# Patient Record
Sex: Male | Born: 1980
Health system: Southern US, Community
[De-identification: ages and names within clinical notes are randomized; demographics above are authoritative.]

## PROBLEM LIST (undated history)

## (undated) DIAGNOSIS — E538 Deficiency of other specified B group vitamins: Secondary | ICD-10-CM

## (undated) DIAGNOSIS — M069 Rheumatoid arthritis, unspecified: Secondary | ICD-10-CM

## (undated) DIAGNOSIS — I10 Essential (primary) hypertension: Secondary | ICD-10-CM

## (undated) DIAGNOSIS — E119 Type 2 diabetes mellitus without complications: Secondary | ICD-10-CM

## (undated) DIAGNOSIS — E785 Hyperlipidemia, unspecified: Secondary | ICD-10-CM

## (undated) HISTORY — DX: Essential (primary) hypertension: I10

## (undated) HISTORY — DX: Deficiency of other specified B group vitamins: E53.8

## (undated) HISTORY — PX: NO PAST SURGERIES: SHX2092

## (undated) HISTORY — DX: Hyperlipidemia, unspecified: E78.5

## (undated) HISTORY — DX: Type 2 diabetes mellitus without complications: E11.9

---

## 2010-09-19 ENCOUNTER — Ambulatory Visit: Payer: Self-pay | Admitting: Internal Medicine

## 2011-10-30 ENCOUNTER — Emergency Department: Payer: Self-pay | Admitting: Emergency Medicine

## 2011-10-30 LAB — BASIC METABOLIC PANEL
BUN: 13 mg/dL (ref 7–18)
Calcium, Total: 8.4 mg/dL — ABNORMAL LOW (ref 8.5–10.1)
Chloride: 107 mmol/L (ref 98–107)
Creatinine: 0.72 mg/dL (ref 0.60–1.30)
EGFR (African American): >60
Glucose: 114 mg/dL — ABNORMAL HIGH (ref 65–99)
Osmolality: 282 (ref 275–301)
Potassium: 4.1 mmol/L (ref 3.5–5.1)
Sodium: 141 mmol/L (ref 136–145)

## 2011-10-30 LAB — CBC
HGB: 15.2 g/dL (ref 13.0–18.0)
MCH: 29.5 pg (ref 26.0–34.0)
MCHC: 33.7 g/dL (ref 32.0–36.0)
MCV: 87 fL (ref 80–100)
Platelet: 213 10*3/uL (ref 150–440)
RBC: 5.17 10*6/uL (ref 4.40–5.90)

## 2014-02-16 DIAGNOSIS — E538 Deficiency of other specified B group vitamins: Secondary | ICD-10-CM | POA: Insufficient documentation

## 2014-11-30 ENCOUNTER — Encounter: Payer: Self-pay | Admitting: Cardiovascular Disease

## 2014-11-30 ENCOUNTER — Ambulatory Visit (INDEPENDENT_AMBULATORY_CARE_PROVIDER_SITE_OTHER): Payer: 59 | Admitting: Cardiovascular Disease

## 2014-11-30 ENCOUNTER — Encounter (INDEPENDENT_AMBULATORY_CARE_PROVIDER_SITE_OTHER): Payer: Self-pay

## 2014-11-30 DIAGNOSIS — I10 Essential (primary) hypertension: Secondary | ICD-10-CM | POA: Insufficient documentation

## 2014-11-30 DIAGNOSIS — E669 Obesity, unspecified: Secondary | ICD-10-CM | POA: Diagnosis not present

## 2014-11-30 DIAGNOSIS — E785 Hyperlipidemia, unspecified: Secondary | ICD-10-CM | POA: Diagnosis not present

## 2014-11-30 DIAGNOSIS — I159 Secondary hypertension, unspecified: Secondary | ICD-10-CM

## 2014-11-30 DIAGNOSIS — E119 Type 2 diabetes mellitus without complications: Secondary | ICD-10-CM

## 2014-11-30 NOTE — Patient Instructions (Signed)
You are doing well. No medication changes were made.  Blood pressure goal <135 on the top, <90 on the bottom  Please monitor your blood pressure at home Call the office with some numbers  Please call us if you have new issues that need to be addressed before your next appt.  Your physician wants you to follow-up in: 12 months.  You will receive a reminder letter in the mail two months in advance. If you don't receive a letter, please call our office to schedule the follow-up appointment.

## 2014-11-30 NOTE — Assessment & Plan Note (Signed)
Recommended he closely monitor his blood pressure at home. He does own a blood pressure cuff. Diastolic pressures running high, will monitor closely at home If he needs additional blood pressure medications, could potentially add clonidine, Cardura, bystolic

## 2014-11-30 NOTE — Progress Notes (Signed)
Patient ID: Casey Townsend, male    DOB: 1981/02/11, 34 y.o.   MRN: [id number]  HPI Comments: Casey Townsend is a pleasant 34 year old gentleman, patient of Dr. Renae Fickle, with history of type 2 diabetes diagnosed March 2014 with most recent hemoglobin A1c 5.6, hyperlipidemia with LDL 94, obesity, hypertension who presents to establish care in the [place] office.  He reports that in general he is feeling well with no complaints. He does have a blood pressure cuff at home but has not been monitoring his blood pressure. Blood pressure elevated today. Was also elevated when last seen by Dr. Renae Fickle. Reports his diabetes symptoms presented with vision issues, now resolved. He is trying to watch his weight, watch his diet.Marland Kitchen He works every night in a mill. No regular aerobic exercise. Continues to struggle with his weight which has been relatively stable.  Weight was 206 pounds 2 years ago, now 223 with Dr. Renae Fickle, 234 in our office  EKG on today's visit shows normal sinus rhythm with rate 86 bpm, no significant ST or T-wave changes    Allergies  Allergen Reactions  . Lisinopril Anaphylaxis    No current outpatient prescriptions on file prior to visit.   No current facility-administered medications on file prior to visit.    Past Medical History  Diagnosis Date  . Hypertension   . Diabetes mellitus without complication   . Vitamin B 12 deficiency   . Hyperlipidemia     History reviewed. No pertinent past surgical history.  Social History  reports that he has never smoked. He does not have any smokeless tobacco history on file. He reports that he drinks about 1.2 oz of alcohol per week. He reports that he does not use illicit drugs.  Family History family history includes Hyperlipidemia in his mother; Hypertension in his mother.      Review of Systems  Constitutional: Negative.   HENT: Negative.   Eyes: Negative.   Respiratory: Negative.   Cardiovascular: Negative.    Gastrointestinal: Negative.   Endocrine: Negative.   Musculoskeletal: Negative.   Skin: Negative.   Allergic/Immunologic: Negative.   Neurological: Negative.   Hematological: Negative.   Psychiatric/Behavioral: Negative.   All other systems reviewed and are negative.   BP 140/96 mmHg  Pulse 86  Ht 5\' 1"  (1.549 m)  Wt 234 lb 12 oz (106.482 kg)  BMI 44.38 kg/m2   Physical Exam  Constitutional: He is oriented to person, place, and time. He appears well-developed and well-nourished.  Obese  HENT:  Head: Normocephalic.  Nose: Nose normal.  Mouth/Throat: Oropharynx is clear and moist.  Eyes: Conjunctivae are normal. Pupils are equal, round, and reactive to light.  Neck: Normal range of motion. Neck supple. No JVD present.  Cardiovascular: Normal rate, regular rhythm, normal heart sounds and intact distal pulses.  Exam reveals no gallop and no friction rub.   No murmur heard. Pulmonary/Chest: Effort normal and breath sounds normal. No respiratory distress. He has no wheezes. He has no rales. He exhibits no tenderness.  Abdominal: Soft. Bowel sounds are normal. He exhibits no distension. There is no tenderness.  Musculoskeletal: Normal range of motion. He exhibits no edema or tenderness.  Lymphadenopathy:    He has no cervical adenopathy.  Neurological: He is alert and oriented to person, place, and time. Coordination normal.  Skin: Skin is warm and dry. No rash noted. No erythema.  Psychiatric: He has a normal mood and affect. His behavior is normal. Judgment and thought content normal.

## 2014-11-30 NOTE — Assessment & Plan Note (Signed)
Dietary guide given to him today. Recommended a low carbohydrate diet

## 2014-11-30 NOTE — Assessment & Plan Note (Signed)
We have encouraged continued exercise, careful diet management in an effort to lose weight. Followed by Dr. Renae Fickle

## 2014-11-30 NOTE — Assessment & Plan Note (Signed)
Cholesterol is at goal on the current lipid regimen. No changes to the medications were made.  

## 2015-03-11 ENCOUNTER — Other Ambulatory Visit: Payer: Self-pay | Admitting: Orthopedic Surgery

## 2015-03-11 DIAGNOSIS — M5416 Radiculopathy, lumbar region: Secondary | ICD-10-CM

## 2015-03-31 ENCOUNTER — Ambulatory Visit
Admission: RE | Admit: 2015-03-31 | Discharge: 2015-03-31 | Disposition: A | Discharge status: Home / Self Care | Payer: 59 | Source: Ambulatory Visit | Attending: Orthopedic Surgery | Admitting: Orthopedic Surgery

## 2015-03-31 DIAGNOSIS — M5416 Radiculopathy, lumbar region: Secondary | ICD-10-CM | POA: Insufficient documentation

## 2016-02-11 ENCOUNTER — Telehealth: Payer: Self-pay | Admitting: Cardiovascular Disease

## 2016-02-11 NOTE — Telephone Encounter (Signed)
Attempted to schedule fu from recall.  Patient changed providers   Deleting recall.

## 2016-03-06 ENCOUNTER — Ambulatory Visit: Payer: 59 | Admitting: Cardiovascular Disease

## 2016-05-01 DIAGNOSIS — I10 Essential (primary) hypertension: Secondary | ICD-10-CM | POA: Diagnosis not present

## 2016-05-01 DIAGNOSIS — E119 Type 2 diabetes mellitus without complications: Secondary | ICD-10-CM | POA: Diagnosis not present

## 2016-05-31 DIAGNOSIS — R509 Fever, unspecified: Secondary | ICD-10-CM | POA: Diagnosis not present

## 2016-05-31 DIAGNOSIS — I10 Essential (primary) hypertension: Secondary | ICD-10-CM | POA: Diagnosis not present

## 2016-07-24 DIAGNOSIS — E538 Deficiency of other specified B group vitamins: Secondary | ICD-10-CM | POA: Diagnosis not present

## 2016-07-24 DIAGNOSIS — E119 Type 2 diabetes mellitus without complications: Secondary | ICD-10-CM | POA: Diagnosis not present

## 2016-07-24 DIAGNOSIS — E785 Hyperlipidemia, unspecified: Secondary | ICD-10-CM | POA: Diagnosis not present

## 2016-08-03 DIAGNOSIS — E785 Hyperlipidemia, unspecified: Secondary | ICD-10-CM | POA: Diagnosis not present

## 2016-08-03 DIAGNOSIS — E119 Type 2 diabetes mellitus without complications: Secondary | ICD-10-CM | POA: Diagnosis not present

## 2016-08-03 DIAGNOSIS — E538 Deficiency of other specified B group vitamins: Secondary | ICD-10-CM | POA: Diagnosis not present

## 2016-11-21 DIAGNOSIS — E785 Hyperlipidemia, unspecified: Secondary | ICD-10-CM | POA: Diagnosis not present

## 2016-11-21 DIAGNOSIS — E538 Deficiency of other specified B group vitamins: Secondary | ICD-10-CM | POA: Diagnosis not present

## 2016-11-21 DIAGNOSIS — E119 Type 2 diabetes mellitus without complications: Secondary | ICD-10-CM | POA: Diagnosis not present

## 2017-02-01 DIAGNOSIS — E119 Type 2 diabetes mellitus without complications: Secondary | ICD-10-CM | POA: Diagnosis not present

## 2017-02-22 DIAGNOSIS — E538 Deficiency of other specified B group vitamins: Secondary | ICD-10-CM | POA: Diagnosis not present

## 2017-02-22 DIAGNOSIS — E785 Hyperlipidemia, unspecified: Secondary | ICD-10-CM | POA: Diagnosis not present

## 2017-02-22 DIAGNOSIS — E119 Type 2 diabetes mellitus without complications: Secondary | ICD-10-CM | POA: Diagnosis not present

## 2017-05-18 DIAGNOSIS — E785 Hyperlipidemia, unspecified: Secondary | ICD-10-CM | POA: Diagnosis not present

## 2017-05-18 DIAGNOSIS — E119 Type 2 diabetes mellitus without complications: Secondary | ICD-10-CM | POA: Diagnosis not present

## 2017-05-22 DIAGNOSIS — E538 Deficiency of other specified B group vitamins: Secondary | ICD-10-CM | POA: Diagnosis not present

## 2017-05-22 DIAGNOSIS — E785 Hyperlipidemia, unspecified: Secondary | ICD-10-CM | POA: Diagnosis not present

## 2017-05-22 DIAGNOSIS — E119 Type 2 diabetes mellitus without complications: Secondary | ICD-10-CM | POA: Diagnosis not present

## 2018-08-19 ENCOUNTER — Encounter: Payer: Self-pay | Admitting: Emergency Medicine

## 2018-08-19 ENCOUNTER — Other Ambulatory Visit: Payer: Self-pay

## 2018-08-19 ENCOUNTER — Ambulatory Visit (INDEPENDENT_AMBULATORY_CARE_PROVIDER_SITE_OTHER): Payer: 59

## 2018-08-19 ENCOUNTER — Ambulatory Visit
Admission: EM | Admit: 2018-08-19 | Discharge: 2018-08-19 | Disposition: A | Discharge status: Home / Self Care | Payer: 59 | Attending: Family Medicine | Admitting: Family Medicine

## 2018-08-19 DIAGNOSIS — M79644 Pain in right finger(s): Secondary | ICD-10-CM | POA: Diagnosis not present

## 2018-08-19 MED ORDER — MELOXICAM 15 MG PO TABS: 15.0000 mg | ORAL_TABLET | Freq: Every day | ORAL | 0 refills | Status: DC | PRN

## 2018-08-19 NOTE — ED Triage Notes (Signed)
Pt c/o right middle finger pain and swelling. Started a couple of weeks ago. No known injury.

## 2018-08-19 NOTE — ED Provider Notes (Addendum)
[facility] ____________________________________________  Time seen: Approximately 8:49 AM  I have reviewed the triage vital signs and the nursing notes.   HISTORY  Chief Complaint Hand Pain (right middle finger)   HPI Casey Townsend is a 38 y.o. male presenting for evaluation of right middle finger swelling and pain present for approximately 1 week.  Denies any known direct injury or trauma.  Reports he is right-handed and does a lot of repetitive movements with his hands, particularly at work.  Does frequently hold a knife in his right hand at work.  States pain to finger is only with direct palpation to swelling area.  Denies paresthesias, decreased range of motion, redness or skin changes.  Right hand otherwise feels normal.  Denies history of same.  Pain currently mild.  Reports otherwise feeling well.  Denies cough, congestion, fevers, chest pain or shortness of breath. Denies renal insufficiency.    Past Medical History:  Diagnosis Date  . Diabetes mellitus without complication (HCC)   . Hyperlipidemia   . Hypertension   . Vitamin B 12 deficiency     Patient Active Problem List   Diagnosis Date Noted  . Essential hypertension 11/30/2014  . Hyperlipidemia 11/30/2014  . Diabetes mellitus type 2, controlled (HCC) 11/30/2014  . Obesity 11/30/2014    Past Surgical History:  Procedure Laterality Date  . NO PAST SURGERIES       No current facility-administered medications for this encounter.   Current Outpatient Medications:  .  amLODipine (NORVASC) 10 MG tablet, Take 10 mg by mouth daily. , Disp: , Rfl:  .  losartan-hydrochlorothiazide (HYZAAR) 100-25 MG per tablet, Take 1 tablet by mouth daily. , Disp: , Rfl:  .  metFORMIN (GLUCOPHAGE) 500 MG tablet, Take 500 mg by mouth 2 (two) times daily with a meal. , Disp: , Rfl:  .  pravastatin (PRAVACHOL) 10 MG tablet, Take 10 mg by mouth every other day. , Disp: , Rfl:  .  meloxicam (MOBIC) 15 MG tablet, Take 1  tablet (15 mg total) by mouth daily as needed., Disp: 10 tablet, Rfl: 0 .  ONETOUCH VERIO test strip, , Disp: , Rfl:   Allergies Lisinopril  Family History  Problem Relation Age of Onset  . Hypertension Mother   . Hyperlipidemia Mother     Social History Social History   Tobacco Use  . Smoking status: Never Smoker  . Smokeless tobacco: Never Used  Substance Use Topics  . Alcohol use: Yes    Alcohol/week: 2.0 standard drinks    Types: 2 Cans of beer per week  . Drug use: No    Review of Systems Constitutional: No fever Cardiovascular: Denies chest pain. Respiratory: Denies shortness of breath. Musculoskeletal: Positive right hand pain. Skin: Negative for rash.   ____________________________________________   PHYSICAL EXAM:  VITAL SIGNS: ED Triage Vitals  Enc Vitals Group     BP 08/19/18 0841 (!) 140/99     Pulse Rate 08/19/18 0841 88     Resp 08/19/18 0841 18     Temp 08/19/18 0841 98.1 F (36.7 C)     Temp Source 08/19/18 0841 Oral     SpO2 08/19/18 0841 97 %     Weight 08/19/18 0839 234 lb (106.1 kg)     Height 08/19/18 0839 5' (1.524 m)     Head Circumference --      Peak Flow --      Pain Score 08/19/18 0839 5     Pain Loc --  Pain Edu? --      Excl. in GC? --     Constitutional: Alert and oriented. Well appearing and in no acute distress. ENT      Head: Normocephalic and atraumatic. Cardiovascular: Normal rate, regular rhythm. Grossly normal heart sounds.  Good peripheral circulation. Respiratory: Normal respiratory effort without tachypnea nor retractions. Breath sounds are clear and equal bilaterally. No wheezes, rales, rhonchi. Musculoskeletal: Steady gait Except: Right third digit medial aspect of PIP joint mild localized firm swelling with associated tenderness, no erythema, no fluctuance, full range of motion present, no motor or tendon deficit noted to right third digit, normal distal sensation and capillary refill, right hand otherwise  nontender. Neurologic:  Normal speech and language.  Skin:  Skin is warm, dry and intact. No rash noted. Psychiatric: Mood and affect are normal. Speech and behavior are normal. Patient exhibits appropriate insight and judgment   ___________________________________________   LABS (all labs ordered are listed, but only abnormal results are displayed)  Labs Reviewed - No data to display ____________________________________________  RADIOLOGY  Dg Finger Middle Right  Result Date: 08/19/2018 CLINICAL DATA:  38 year old male with joint pain EXAM: RIGHT MIDDLE FINGER 2+V COMPARISON:  None. FINDINGS: Contour asymmetry on the radial aspect of the PIP joint of the third digit, right hand. No acute fracture. No subluxation/dislocation. No erosive changes or significant degenerative changes. No osteopenia. No radiopaque foreign body. IMPRESSION: Negative for acute bony abnormality. There is nonspecific soft tissue asymmetry along the radial aspect of the PIP joint of third digit right hand. Electronically Signed   By: Gilmer Mor D.O.   On: 08/19/2018 09:10   ____________________________________________   PROCEDURES Procedures     INITIAL IMPRESSION / ASSESSMENT AND PLAN / ED COURSE  Pertinent labs & imaging results that were available during my care of the patient were reviewed by me and considered in my medical decision making (see chart for details).  Well-appearing patient.  No acute distress.  Right third digit pain.  Frequent repetitive movements.  No erythema, no skin changes or break in skin, doubt infection.  X-ray as above per radiologist, negative for acute bony abnormality, asymmetry as noted.  Suspect overuse calcific area.  Will treat with oral Mobic, discussed monitoring and trying to alter repetitive movements to area.  Follow-up with orthopedic for continued complaints.  Supportive care.Discussed indication, risks and benefits of medications with patient.  Discussed follow  up with Primary care physician this week as needed. Discussed follow up and return parameters including no resolution or any worsening concerns. Patient verbalized understanding and agreed to plan.   ____________________________________________   FINAL CLINICAL IMPRESSION(S) / ED DIAGNOSES  Final diagnoses:  Pain of right middle finger     ED Discharge Orders         Ordered    meloxicam (MOBIC) 15 MG tablet  Daily PRN     08/19/18 0920           Note: This dictation was prepared with Dragon dictation along with smaller phrase technology. Any transcriptional errors that result from this process are unintentional.         Renford Dills, NP 08/19/18 0950    Renford Dills, NP 08/19/18 [phone]

## 2018-08-19 NOTE — Discharge Instructions (Signed)
Take medication as prescribed. Rest. Monitor how you're holding items at work.   Follow up with orthopedic as needed for continued discomfort.  Follow up with your primary care physician this week as needed. Return to Urgent care for new or worsening concerns.

## 2018-12-17 ENCOUNTER — Ambulatory Visit
Admission: EM | Admit: 2018-12-17 | Discharge: 2018-12-17 | Disposition: A | Discharge status: Home / Self Care | Payer: 59 | Attending: Emergency Medicine | Admitting: Emergency Medicine

## 2018-12-17 ENCOUNTER — Other Ambulatory Visit: Payer: Self-pay

## 2018-12-17 DIAGNOSIS — M722 Plantar fascial fibromatosis: Secondary | ICD-10-CM | POA: Diagnosis not present

## 2018-12-17 MED ORDER — MELOXICAM 15 MG PO TABS: 15.0000 mg | ORAL_TABLET | Freq: Every day | ORAL | 0 refills | Status: DC | PRN

## 2018-12-17 NOTE — ED Triage Notes (Signed)
Patient complains of bilateral foot pain from heel and runs down the side of his foot.

## 2018-12-17 NOTE — Discharge Instructions (Addendum)
Take medication as prescribed. Ice. Stretch. Good supportive shoes.   Follow up with podiatry in 1-2 weeks as needed for continued pain.   Follow up with your primary care physician this week as needed. Return to Urgent care for new or worsening concerns.

## 2018-12-17 NOTE — ED Provider Notes (Signed)
[facility] ____________________________________________  Time seen: Approximately 12:43 PM  I have reviewed the triage vital signs and the nursing notes.   HISTORY  Chief Complaint Foot Pain (bilateral)   HPI Casey Townsend is a 38 y.o. male presenting for evaluation of bilateral heel pain present for the last 2 to 3 weeks.  Reports this is been gradual in onset.  Denies abrupt onset, fall, injury or direct trauma.  Does report he has to wear steel toed boots and stands on a hard concrete surface all day, up to 12 hours per shift.  Does have a pad that he can stand on.  Also recently 2 weeks ago purchased insert soles to his shoes to help, which did help some.  States he does have some pain per se in the morning, but pain is worse after a full day of work.  States has occasionally taken ibuprofen which takes away the pain.  Denies pain radiation, paresthesias, skin changes or injury.  Reports otherwise doing well.  No recent chest pain, shortness of breath or fevers.  States he only got up just a little while ago and has not yet taken his blood pressure medicine or eaten.  States he will take it as soon as he leaves with food.  Reports his blood pressure has been well managed recently.  Reports otherwise doing well.   Past Medical History:  Diagnosis Date  . Diabetes mellitus without complication ([place])   . Hyperlipidemia   . Hypertension   . Vitamin B 12 deficiency     Patient Active Problem List   Diagnosis Date Noted  . Essential hypertension 11/30/2014  . Hyperlipidemia 11/30/2014  . Diabetes mellitus type 2, controlled ([place]) 11/30/2014  . Obesity 11/30/2014    Past Surgical History:  Procedure Laterality Date  . NO PAST SURGERIES       No current facility-administered medications for this encounter.   Current Outpatient Medications:  .  amLODipine (NORVASC) 10 MG tablet, Take 10 mg by mouth daily. , Disp: , Rfl:  .  losartan-hydrochlorothiazide (HYZAAR)  100-25 MG per tablet, Take 1 tablet by mouth daily. , Disp: , Rfl:  .  metFORMIN (GLUCOPHAGE) 500 MG tablet, Take 500 mg by mouth 2 (two) times daily with a meal. , Disp: , Rfl:  .  ONETOUCH VERIO test strip, , Disp: , Rfl:  .  pravastatin (PRAVACHOL) 10 MG tablet, Take 10 mg by mouth every other day. , Disp: , Rfl:  .  meloxicam (MOBIC) 15 MG tablet, Take 1 tablet (15 mg total) by mouth daily as needed., Disp: 10 tablet, Rfl: 0  Allergies Lisinopril  Family History  Problem Relation Age of Onset  . Hypertension Mother   . Hyperlipidemia Mother     Social History Social History   Tobacco Use  . Smoking status: Never Smoker  . Smokeless tobacco: Never Used  Substance Use Topics  . Alcohol use: Yes    Alcohol/week: 2.0 standard drinks    Types: 2 Cans of beer per week  . Drug use: No    Review of Systems Constitutional: No fever ENT: No sore throat. Cardiovascular: Denies chest pain. Respiratory: Denies shortness of breath. Gastrointestinal: No abdominal pain.   Musculoskeletal: Positive bilateral heel pain. Skin: Negative for rash.   ____________________________________________   PHYSICAL EXAM:  VITAL SIGNS: ED Triage Vitals [12/17/18 1219]  Enc Vitals Group     BP (!) 149/111     Pulse Rate 86     Resp 18  Temp 98.1 F (36.7 C)     Temp Source Oral     SpO2 97 %     Weight 234 lb (106.1 kg)     Height 5' (1.524 m)     Head Circumference      Peak Flow      Pain Score 5     Pain Loc      Pain Edu?      Excl. in GC?     Constitutional: Alert and oriented. Well appearing and in no acute distress. Eyes: Conjunctivae are normal.  ENT      Head: Normocephalic and atraumatic. Cardiovascular: Normal rate, regular rhythm. Grossly normal heart sounds.  Good peripheral circulation. Respiratory: Normal respiratory effort without tachypnea nor retractions. Breath sounds are clear and equal bilaterally. No wheezes, rales, rhonchi. Musculoskeletal:  Bilateral  pedal pulses equal and easily palpated. Except: Left plantar heel minimal tenderness to direct palpation at proximal arch, foot otherwise nontender, no bony tenderness, full range of motion present. Except: Right plantar hill at proximal arch and mid to laterally tenderness to direct palpation, minimal tenderness along arch, no bony tenderness, full range of motion present, mild tightness with full dorsiflexion, no pain with plantarflexion, normal distal sensation and capillary refill, no bony tenderness, right leg otherwise nontender.  No edema. Neurologic:  Normal speech and language. No gait instability.  Skin:  Skin is warm, dry and intact. No rash noted. Psychiatric: Mood and affect are normal. Speech and behavior are normal. Patient exhibits appropriate insight and judgment   ___________________________________________   LABS (all labs ordered are listed, but only abnormal results are displayed)  Labs Reviewed - No data to display   INITIAL IMPRESSION / ASSESSMENT AND PLAN / ED COURSE  Pertinent labs & imaging results that were available during my care of the patient were reviewed by me and considered in my medical decision making (see chart for details).  Well-appearing patient.  No acute distress.  Bilateral heel pain.  Works on hard surface all day and wear steel toed boots.  Subjective report and clinical exam consistent with plantar fasciitis.  Patient denies direct injury or trauma, will defer x-ray at this time, patient agrees.  Will treat with Mobic.  Discussed ice, stretches, supportive care, good inserts and supportive shoes.  Follow-up with podiatry for continued pain.Discussed indication, risks and benefits of medications with patient.  Discussed follow up and return parameters including no resolution or any worsening concerns. Patient verbalized understanding and agreed to plan.   ____________________________________________   FINAL CLINICAL IMPRESSION(S) / ED DIAGNOSES   Final diagnoses:  Plantar fasciitis     ED Discharge Orders         Ordered    meloxicam (MOBIC) 15 MG tablet  Daily PRN     12/17/18 1241           Note: This dictation was prepared with Dragon dictation along with smaller phrase technology. Any transcriptional errors that result from this process are unintentional.         Renford Dills, NP 12/17/18 1250

## 2019-01-20 ENCOUNTER — Ambulatory Visit (INDEPENDENT_AMBULATORY_CARE_PROVIDER_SITE_OTHER): Payer: 59

## 2019-01-20 ENCOUNTER — Ambulatory Visit: Payer: 59 | Admitting: Podiatry

## 2019-01-20 ENCOUNTER — Encounter: Payer: Self-pay | Admitting: Podiatry

## 2019-01-20 ENCOUNTER — Other Ambulatory Visit: Payer: Self-pay

## 2019-01-20 DIAGNOSIS — M722 Plantar fascial fibromatosis: Secondary | ICD-10-CM

## 2019-01-20 MED ORDER — MELOXICAM 15 MG PO TABS: 15.0000 mg | ORAL_TABLET | Freq: Every day | ORAL | 3 refills | Status: DC

## 2019-01-20 MED ORDER — METHYLPREDNISOLONE 4 MG PO TBPK: ORAL_TABLET | ORAL | 0 refills | Status: DC

## 2019-01-20 NOTE — Progress Notes (Signed)
Subjective:  Patient ID: Casey Townsend, male    DOB: [birth year],  MRN: [id number] HPI Chief Complaint  Patient presents with  . Foot Pain    Patient presents today for bilat heel/lateral side foot pain x 2-3 weeks.  He reports right is worse than left and feels like he is walking on a bruise.  Mornings are very painful and when standing from sitting.  He has taken Advil, Meloxicam, used ice and inserts with some reilef    38 y.o. male presents with the above complaint.   ROS: He denies fever chills nausea vomiting muscle aches pains calf pain back pain chest pain shortness of breath.  Past Medical History:  Diagnosis Date  . Diabetes mellitus without complication (HCC)   . Hyperlipidemia   . Hypertension   . Vitamin B 12 deficiency    Past Surgical History:  Procedure Laterality Date  . NO PAST SURGERIES      Current Outpatient Medications:  .  empagliflozin (JARDIANCE) 10 MG TABS tablet, Take by mouth., Disp: , Rfl:  .  amLODipine (NORVASC) 10 MG tablet, Take 10 mg by mouth daily. , Disp: , Rfl:  .  hydrochlorothiazide (HYDRODIURIL) 25 MG tablet, , Disp: , Rfl:  .  losartan (COZAAR) 100 MG tablet, , Disp: , Rfl:  .  meloxicam (MOBIC) 15 MG tablet, Take 1 tablet (15 mg total) by mouth daily., Disp: 30 tablet, Rfl: 3 .  metFORMIN (GLUCOPHAGE) 500 MG tablet, Take 500 mg by mouth 2 (two) times daily with a meal. , Disp: , Rfl:  .  Multiple Vitamin (MULTI-VITAMIN) tablet, Take by mouth., Disp: , Rfl:  .  ONETOUCH VERIO test strip, , Disp: , Rfl:  .  pravastatin (PRAVACHOL) 10 MG tablet, Take 10 mg by mouth every other day. , Disp: , Rfl:   Allergies  Allergen Reactions  . Lisinopril Anaphylaxis   Review of Systems Objective:  There were no vitals filed for this visit.  General: Well developed, nourished, in no acute distress, alert and oriented x3   Dermatological: Skin is warm, dry and supple bilateral. Nails x 10 are well maintained; remaining integument appears  unremarkable at this time. There are no open sores, no preulcerative lesions, no rash or signs of infection present.  Vascular: Dorsalis Pedis artery and Posterior Tibial artery pedal pulses are 2/4 bilateral with immedate capillary fill time. Pedal hair growth present. No varicosities and no lower extremity edema present bilateral.   Neruologic: Grossly intact via light touch bilateral. Vibratory intact via tuning fork bilateral. Protective threshold with Semmes Wienstein monofilament intact to all pedal sites bilateral. Patellar and Achilles deep tendon reflexes 2+ bilateral. No Babinski or clonus noted bilateral.   Musculoskeletal: No gross boney pedal deformities bilateral. No pain, crepitus, or limitation noted with foot and ankle range of motion bilateral. Muscular strength 5/5 in all groups tested bilateral.  Pain on palpation to the central and lateral calcaneal tubercles of the right foot central and medial calcaneal tubercles left foot.  Gait: Unassisted, Nonantalgic.    Radiographs:  Radiographs taken today demonstrate no significant osseous abnormality soft tissue swelling upon fashion calcaneal insertion sites right greater than left.  Assessment & Plan:   Assessment: Lateral plantar fasciitis right medial plantar fasciitis left  Plan: Discussed etiology pathology conservative versus surgical therapies.  At this point I injected bilaterally 20 mg Kenalog 5 mg Marcaine point of maximal tenderness.  Start him on meloxicam placed on plantar fascial braces and a single night splint.  He is diabetic so I did not use Medrol.  Follow-up with him in 1 month     Elyana Grabski T. [place], [place]

## 2019-01-20 NOTE — Patient Instructions (Signed)

## 2019-03-03 ENCOUNTER — Ambulatory Visit: Payer: 59 | Admitting: Podiatry

## 2019-03-05 ENCOUNTER — Ambulatory Visit: Payer: 59

## 2019-03-05 ENCOUNTER — Other Ambulatory Visit: Payer: Self-pay

## 2019-03-05 ENCOUNTER — Ambulatory Visit
Admission: EM | Admit: 2019-03-05 | Discharge: 2019-03-05 | Disposition: A | Discharge status: Home / Self Care | Payer: 59 | Attending: Family Medicine | Admitting: Family Medicine

## 2019-03-05 ENCOUNTER — Ambulatory Visit (INDEPENDENT_AMBULATORY_CARE_PROVIDER_SITE_OTHER): Payer: 59

## 2019-03-05 ENCOUNTER — Encounter: Payer: Self-pay | Admitting: Emergency Medicine

## 2019-03-05 DIAGNOSIS — M79642 Pain in left hand: Secondary | ICD-10-CM

## 2019-03-05 DIAGNOSIS — M778 Other enthesopathies, not elsewhere classified: Secondary | ICD-10-CM | POA: Diagnosis not present

## 2019-03-05 DIAGNOSIS — X500XXA Overexertion from strenuous movement or load, initial encounter: Secondary | ICD-10-CM | POA: Diagnosis not present

## 2019-03-05 DIAGNOSIS — M79643 Pain in unspecified hand: Secondary | ICD-10-CM

## 2019-03-05 DIAGNOSIS — S46919A Strain of unspecified muscle, fascia and tendon at shoulder and upper arm level, unspecified arm, initial encounter: Secondary | ICD-10-CM

## 2019-03-05 MED ORDER — HYDROCODONE-ACETAMINOPHEN 5-325 MG PO TABS: ORAL_TABLET | ORAL | 0 refills | Status: DC

## 2019-03-05 MED ORDER — CYCLOBENZAPRINE HCL 10 MG PO TABS: 10.0000 mg | ORAL_TABLET | Freq: Every day | ORAL | 0 refills | Status: DC

## 2019-03-05 NOTE — Discharge Instructions (Signed)
Rest, ice, over the counter ibuprofen

## 2019-03-05 NOTE — ED Triage Notes (Signed)
Patient in today c/o 2 day history of bilateral shoulder pain, L>R and left hand/pinky finger pain. No injury noted. Patient has taken OTC Tylenol without relief.

## 2019-03-05 NOTE — ED Provider Notes (Signed)
[facility]    CSN: [id number] Arrival date & time: 03/05/19  1122      History   Chief Complaint Chief Complaint  Patient presents with  . Shoulder Pain  . Hand Pain    HPI Casey Townsend is a 38 y.o. male.   38 yo male with a c/o left hand pain, mainly over the 5th finger associated with swelling of the pinky finger for the past week. Also c/o shoulder pains to both shoulders. Denies any falls or other traumatic injury. He state that he does a lot of heavy work involving lifting and gripping with his hands. Denies any fevers, chills, rash, numbness/tingling.    Shoulder Pain Hand Pain    Past Medical History:  Diagnosis Date  . Diabetes mellitus without complication (HCC)   . Hyperlipidemia   . Hypertension   . Vitamin B 12 deficiency     Patient Active Problem List   Diagnosis Date Noted  . Essential hypertension 11/30/2014  . Hyperlipidemia 11/30/2014  . Diabetes mellitus type 2, controlled (HCC) 11/30/2014  . Obesity 11/30/2014  . B12 deficiency 02/16/2014    Past Surgical History:  Procedure Laterality Date  . NO PAST SURGERIES         Home Medications    Prior to Admission medications   Medication Sig Start Date End Date Taking? Authorizing Provider  amLODipine (NORVASC) 10 MG tablet Take 10 mg by mouth daily.  11/20/14  Yes [provider]  empagliflozin (JARDIANCE) 10 MG TABS tablet Take by mouth. 07/20/17  Yes [provider]  hydrochlorothiazide (HYDRODIURIL) 25 MG tablet  11/18/18  Yes [provider]  losartan (COZAAR) 100 MG tablet  11/13/18  Yes [provider]  meloxicam (MOBIC) 15 MG tablet Take 1 tablet (15 mg total) by mouth daily. 01/20/19  Yes Hyatt, Max T, DPM  metFORMIN (GLUCOPHAGE) 500 MG tablet Take 500 mg by mouth 2 (two) times daily with a meal.  11/20/14  Yes [provider]  Multiple Vitamin (MULTI-VITAMIN) tablet Take by mouth.   Yes [provider]  ONETOUCH  VERIO test strip  10/23/14  Yes [provider]  pravastatin (PRAVACHOL) 10 MG tablet Take 10 mg by mouth every other day.  11/04/14  Yes [provider]  cyclobenzaprine (FLEXERIL) 10 MG tablet Take 1 tablet (10 mg total) by mouth at bedtime. 03/05/19   Payton Mccallum, MD  HYDROcodone-acetaminophen (NORCO/VICODIN) 5-325 MG tablet 1-2 tabs po qd prn 03/05/19   Payton Mccallum, MD    Family History Family History  Problem Relation Age of Onset  . Hypertension Mother   . Hyperlipidemia Mother   . Diabetes Mother   . Other Father        homicide    Social History Social History   Tobacco Use  . Smoking status: Never Smoker  . Smokeless tobacco: Never Used  Substance Use Topics  . Alcohol use: Yes    Alcohol/week: 2.0 standard drinks    Types: 2 Cans of beer per week  . Drug use: No     Allergies   Lisinopril   Review of Systems Review of Systems   Physical Exam Triage Vital Signs ED Triage Vitals  Enc Vitals Group     BP 03/05/19 1139 (!) 142/99     Pulse Rate 03/05/19 1139 85     Resp 03/05/19 1139 16     Temp 03/05/19 1139 98.2 F (36.8 C)     Temp Source 03/05/19 1139 Oral  SpO2 03/05/19 1139 97 %     Weight 03/05/19 1138 234 lb (106.1 kg)     Height 03/05/19 1138 5\' 1"  (1.549 m)     Head Circumference --      Peak Flow --      Pain Score 03/05/19 1138 6     Pain Loc --      Pain Edu? --      Excl. in GC? --    No data found.  Updated Vital Signs BP (!) 142/99 (BP Location: Right Arm)   Pulse 85   Temp 98.2 F (36.8 C) (Oral)   Resp 16   Ht 5\' 1"  (1.549 m)   Wt 106.1 kg   SpO2 97%   BMI 44.21 kg/m   Visual Acuity Right Eye Distance:   Left Eye Distance:   Bilateral Distance:    Right Eye Near:   Left Eye Near:    Bilateral Near:     Physical Exam Vitals signs and nursing note reviewed.  Constitutional:      General: He is not in acute distress.    Appearance: He is not toxic-appearing or diaphoretic.   Musculoskeletal:     Right shoulder: He exhibits tenderness. He exhibits normal range of motion, no bony tenderness, no swelling, no effusion, no crepitus, no deformity, no laceration, no pain, no spasm, normal pulse and normal strength.     Left shoulder: He exhibits tenderness. He exhibits normal range of motion, no bony tenderness, no swelling, no effusion, no crepitus, no deformity, no laceration, no spasm, normal pulse and normal strength.     Left hand: He exhibits decreased range of motion (5th finger), tenderness (over the 5th finger), bony tenderness (over the 5th finger) and swelling (over the 5th finger). He exhibits normal two-point discrimination, normal capillary refill, no deformity and no laceration. Normal sensation noted. Normal strength noted.     Comments: Upper extremities neurovascularly intact  Neurological:     Mental Status: He is alert.      UC Treatments / Results  Labs (all labs ordered are listed, but only abnormal results are displayed) Labs Reviewed - No data to display  EKG   Radiology Dg Hand Complete Left  Result Date: 03/05/2019 CLINICAL DATA:  38 year old male with pain in the fifth digit. EXAM: LEFT HAND - COMPLETE 3+ VIEW COMPARISON:  None. FINDINGS: There is no acute fracture or dislocation. No arthritic changes. The soft tissues are unremarkable. IMPRESSION: Negative. Electronically Signed   By: Elgie Collard M.D.   On: 03/05/2019 12:54    Procedures Procedures (including critical care time)  Medications Ordered in UC Medications - No data to display  Initial Impression / Assessment and Plan / UC Course  I have reviewed the triage vital signs and the nursing notes.  Pertinent labs & imaging results that were available during my care of the patient were reviewed by me and considered in my medical decision making (see chart for details).      Final Clinical Impressions(s) / UC Diagnoses   Final diagnoses:  Left hand tendonitis   Strain of shoulder, unspecified laterality, initial encounter     Discharge Instructions     Rest, ice, over the counter ibuprofen     ED Prescriptions    Medication Sig Dispense Auth. Provider   cyclobenzaprine (FLEXERIL) 10 MG tablet Take 1 tablet (10 mg total) by mouth at bedtime. 10 tablet Payton Mccallum, MD   HYDROcodone-acetaminophen (NORCO/VICODIN) 5-325 MG tablet 1-2 tabs po  qd prn 6 tablet Norval Gable, MD      1. x-ray results and diagnosis reviewed with patient 2. rx as per orders above; reviewed possible side effects, interactions, risks and benefits  3. Recommend supportive treatment as above 4. Follow-up prn if symptoms worsen or don't improve  I have reviewed the PDMP during this encounter.   Norval Gable, MD 03/05/19 [place]ly [place]

## 2019-04-14 ENCOUNTER — Ambulatory Visit: Payer: 59 | Admitting: Podiatry

## 2019-04-16 ENCOUNTER — Other Ambulatory Visit: Payer: Self-pay | Admitting: Podiatry

## 2019-04-28 ENCOUNTER — Ambulatory Visit: Payer: 59 | Admitting: Podiatry

## 2019-05-26 ENCOUNTER — Encounter: Payer: Self-pay | Admitting: Podiatry

## 2019-05-26 ENCOUNTER — Ambulatory Visit (INDEPENDENT_AMBULATORY_CARE_PROVIDER_SITE_OTHER): Payer: 59 | Admitting: Podiatry

## 2019-05-26 ENCOUNTER — Other Ambulatory Visit: Payer: Self-pay

## 2019-05-26 DIAGNOSIS — M722 Plantar fascial fibromatosis: Secondary | ICD-10-CM | POA: Diagnosis not present

## 2019-05-26 NOTE — Progress Notes (Signed)
He presents today for follow-up of his bilateral plantar fasciitis.  He states that he has been wearing his braces and his medication has been helpful.  Objective: Vital signs are stable he is alert and oriented x3.  No reproducible pain on palpation.  Assessment: Resolving plantar fasciitis.  Plan: Follow-up with me on an as-needed basis discussed appropriate shoe gear stretching exercise ice therapy shoe modifications and continued use of his meloxicam.

## 2019-06-23 ENCOUNTER — Ambulatory Visit
Admission: EM | Admit: 2019-06-23 | Discharge: 2019-06-23 | Disposition: A | Discharge status: Home / Self Care | Payer: 59 | Attending: Family Medicine | Admitting: Family Medicine

## 2019-06-23 ENCOUNTER — Encounter: Payer: Self-pay | Admitting: Emergency Medicine

## 2019-06-23 ENCOUNTER — Other Ambulatory Visit: Payer: Self-pay

## 2019-06-23 DIAGNOSIS — M2550 Pain in unspecified joint: Secondary | ICD-10-CM

## 2019-06-23 LAB — COMPREHENSIVE METABOLIC PANEL
ALT: 19 U/L (ref 0–44)
AST: 13 U/L — ABNORMAL LOW (ref 15–41)
Albumin: 4 g/dL (ref 3.5–5.0)
Alkaline Phosphatase: 71 U/L (ref 38–126)
Anion gap: 7 (ref 5–15)
BUN: 19 mg/dL (ref 6–20)
CO2: 27 mmol/L (ref 22–32)
Calcium: 9.1 mg/dL (ref 8.9–10.3)
Chloride: 97 mmol/L — ABNORMAL LOW (ref 98–111)
Creatinine, Ser: 0.6 mg/dL — ABNORMAL LOW (ref 0.61–1.24)
GFR calc Af Amer: >60 mL/min (ref >60)
GFR calc non Af Amer: >60 mL/min (ref >60)
Glucose, Bld: 158 mg/dL — ABNORMAL HIGH (ref 70–99)
Potassium: 3.7 mmol/L (ref 3.5–5.1)
Sodium: 131 mmol/L — ABNORMAL LOW (ref 135–145)
Total Bilirubin: 0.8 mg/dL (ref 0.3–1.2)
Total Protein: 8.5 g/dL — ABNORMAL HIGH (ref 6.5–8.1)

## 2019-06-23 LAB — CBC WITH DIFFERENTIAL/PLATELET
Abs Immature Granulocytes: 0.05 10*3/uL (ref 0.00–0.07)
Basophils Absolute: 0 10*3/uL (ref 0.0–0.1)
Basophils Relative: 0 %
Eosinophils Absolute: 0.1 10*3/uL (ref 0.0–0.5)
Eosinophils Relative: 1 %
HCT: 48.3 % (ref 39.0–52.0)
Hemoglobin: 16.2 g/dL (ref 13.0–17.0)
Immature Granulocytes: 0 %
Lymphocytes Relative: 11 %
Lymphs Abs: 1.3 10*3/uL (ref 0.7–4.0)
MCH: 27.2 pg (ref 26.0–34.0)
MCHC: 33.5 g/dL (ref 30.0–36.0)
MCV: 81 fL (ref 80.0–100.0)
Monocytes Absolute: 0.8 10*3/uL (ref 0.1–1.0)
Monocytes Relative: 7 %
Neutro Abs: 9.6 10*3/uL — ABNORMAL HIGH (ref 1.7–7.7)
Neutrophils Relative %: 81 %
Platelets: 326 10*3/uL (ref 150–400)
RBC: 5.96 MIL/uL — ABNORMAL HIGH (ref 4.22–5.81)
RDW: 12.5 % (ref 11.5–15.5)
WBC: 11.9 10*3/uL — ABNORMAL HIGH (ref 4.0–10.5)
nRBC: 0 % (ref 0.0–0.2)

## 2019-06-23 LAB — SEDIMENTATION RATE: Sed Rate: 17 mm/hr — ABNORMAL HIGH (ref 0–15)

## 2019-06-23 LAB — CK: Total CK: <5 U/L — ABNORMAL LOW (ref 49–397)

## 2019-06-23 LAB — TSH: TSH: 1.325 u[IU]/mL (ref 0.350–4.500)

## 2019-06-23 MED ORDER — DICLOFENAC SODIUM 75 MG PO TBEC: 75.0000 mg | DELAYED_RELEASE_TABLET | Freq: Two times a day (BID) | ORAL | 0 refills | Status: DC | PRN

## 2019-06-23 NOTE — Discharge Instructions (Signed)
Stop Pravastatin.  We will call if any of the send out tests are abnormal.  Medication as prescribed.  Follow up with PCP [facility]). If persists, will need referral to rheumatology.  Take care  Dr. Adriana Simas

## 2019-06-23 NOTE — ED Triage Notes (Signed)
Patient c/o joint pain that started 2 weeks ago. He states his knees, hands, elbows and shoulders are painful.

## 2019-06-23 NOTE — ED Provider Notes (Addendum)
[facility]    CSN: [id number] Arrival date & time: 06/23/19  1419      History   Chief Complaint Chief Complaint  Patient presents with  . Joint Pain   HPI  39 year old male presents with joint pain.  2-week history of joint pain.  Locations: Bilateral knees, hands/digits, elbows, shoulders.  He reports swelling of the joints of his digits.  States that he is stiff and experiences pain throughout the day.  Does not appear to be worse in the morning.  Does not get better as the day goes on.  He takes ibuprofen with brief improvement.  No other relieving factors.  No new medication changes.  He is currently on a statin.  Rates his pain a 6/10 in severity.  Denies redness/synovitis.  No fever.  No weight loss.  No other associated symptoms.  No other complaints.  Past Medical History:  Diagnosis Date  . Diabetes mellitus without complication (HCC)   . Hyperlipidemia   . Hypertension   . Vitamin B 12 deficiency     Patient Active Problem List   Diagnosis Date Noted  . Essential hypertension 11/30/2014  . Hyperlipidemia 11/30/2014  . Diabetes mellitus type 2, controlled (HCC) 11/30/2014  . Obesity 11/30/2014  . B12 deficiency 02/16/2014    Past Surgical History:  Procedure Laterality Date  . NO PAST SURGERIES     Home Medications    Prior to Admission medications   Medication Sig Start Date End Date Taking? Authorizing Provider  amLODipine (NORVASC) 10 MG tablet Take 10 mg by mouth daily.  11/20/14  Yes [provider]  empagliflozin (JARDIANCE) 10 MG TABS tablet Take by mouth. 07/20/17  Yes [provider]  hydrochlorothiazide (HYDRODIURIL) 25 MG tablet  11/18/18  Yes [provider]  HYDROcodone-acetaminophen (NORCO/VICODIN) 5-325 MG tablet 1-2 tabs po qd prn 03/05/19  Yes Conty, Pamala Hurry, MD  losartan (COZAAR) 100 MG tablet  11/13/18  Yes [provider]  metFORMIN (GLUCOPHAGE) 1000 MG tablet Take 1,000 mg by mouth 2 (two)  times daily. 05/17/19  Yes [provider]  Multiple Vitamin (MULTI-VITAMIN) tablet Take by mouth.   Yes [provider]  pravastatin (PRAVACHOL) 10 MG tablet Take 10 mg by mouth every other day.  11/04/14 06/23/19 Yes [provider]  diclofenac (VOLTAREN) 75 MG EC tablet Take 1 tablet (75 mg total) by mouth 2 (two) times daily as needed for moderate pain. 06/23/19   Tommie Sams, DO  ONETOUCH VERIO test strip  10/23/14   [provider]    Family History Family History  Problem Relation Age of Onset  . Hypertension Mother   . Hyperlipidemia Mother   . Diabetes Mother   . Other Father        homicide    Social History Social History   Tobacco Use  . Smoking status: Never Smoker  . Smokeless tobacco: Never Used  Substance Use Topics  . Alcohol use: Yes    Alcohol/week: 2.0 standard drinks    Types: 2 Cans of beer per week  . Drug use: No     Allergies   Lisinopril   Review of Systems Review of Systems  Constitutional: Negative.   Musculoskeletal: Positive for arthralgias.   Physical Exam Triage Vital Signs ED Triage Vitals  Enc Vitals Group     BP 06/23/19 1441 (!) 151/115     Pulse Rate 06/23/19 1441 100     Resp 06/23/19 1441 18  Temp 06/23/19 1441 98.4 F (36.9 C)     Temp Source 06/23/19 1441 Oral     SpO2 06/23/19 1441 96 %     Weight 06/23/19 1439 230 lb (104.3 kg)     Height 06/23/19 1439 5\' 1"  (1.549 m)     Head Circumference --      Peak Flow --      Pain Score 06/23/19 1439 6     Pain Loc --      Pain Edu? --      Excl. in GC? --    Updated Vital Signs BP (!) 151/115 (BP Location: Right Arm)   Pulse 100   Temp 98.4 F (36.9 C) (Oral)   Resp 18   Ht 5\' 1"  (1.549 m)   Wt 104.3 kg   SpO2 96%   BMI 43.46 kg/m   Visual Acuity Right Eye Distance:   Left Eye Distance:   Bilateral Distance:    Right Eye Near:   Left Eye Near:    Bilateral Near:     Physical Exam Vitals and nursing note reviewed.    Constitutional:      General: He is not in acute distress.    Appearance: Normal appearance. He is obese. He is not ill-appearing.  HENT:     Head: Normocephalic and atraumatic.  Eyes:     General:        Right eye: No discharge.        Left eye: No discharge.     Conjunctiva/sclera: Conjunctivae normal.  Cardiovascular:     Rate and Rhythm: Regular rhythm. Tachycardia present.     Heart sounds: No murmur.  Pulmonary:     Effort: Pulmonary effort is normal.     Breath sounds: Normal breath sounds. No wheezing, rhonchi or rales.  Musculoskeletal:     Comments: Mild swelling noted of the digits.  Normal range of motion.  No evidence of synovitis.  Neurological:     Mental Status: He is alert.  Psychiatric:        Mood and Affect: Mood normal.        Behavior: Behavior normal.    UC Treatments / Results  Labs (all labs ordered are listed, but only abnormal results are displayed) Labs Reviewed  CK - Abnormal; Notable for the following components:      Result Value   Total CK <5 (*)    All other components within normal limits  CBC WITH DIFFERENTIAL/PLATELET - Abnormal; Notable for the following components:   WBC 11.9 (*)    RBC 5.96 (*)    Neutro Abs 9.6 (*)    All other components within normal limits  COMPREHENSIVE METABOLIC PANEL - Abnormal; Notable for the following components:   Sodium 131 (*)    Chloride 97 (*)    Glucose, Bld 158 (*)    Creatinine, Ser 0.60 (*)    Total Protein 8.5 (*)    AST 13 (*)    All other components within normal limits  ANTINUCLEAR ANTIBODIES, IFA  RHEUMATOID FACTOR  CYCLIC CITRUL PEPTIDE ANTIBODY, IGG/IGA  SEDIMENTATION RATE  TSH    EKG   Radiology No results found.  Procedures Procedures (including critical care time)  Medications Ordered in UC Medications - No data to display  Initial Impression / Assessment and Plan / UC Course  I have reviewed the triage vital signs and the nursing notes.  Pertinent labs & imaging  results that were available during my care of the  patient were reviewed by me and considered in my medical decision making (see chart for details).    39 year old male presents with polyarthralgia.  New problem with uncertain diagnosis & prognosis. Possibly related to statin.  Advised to stop statin at this time.  Diclofenac as needed.  Awaiting rheumatologic work-up (lab results).  Final Clinical Impressions(s) / UC Diagnoses   Final diagnoses:  Polyarthralgia     Discharge Instructions     Stop Pravastatin.  We will call if any of the send out tests are abnormal.  Medication as prescribed.  Follow up with PCP [facility]). If persists, will need referral to rheumatology.  Take care  Dr. Lacinda Axon    ED Prescriptions    Medication Sig Dispense Auth. Provider   diclofenac (VOLTAREN) 75 MG EC tablet Take 1 tablet (75 mg total) by mouth 2 (two) times daily as needed for moderate pain. 30 tablet Coral Spikes, DO     PDMP not reviewed this encounter.   Coral Spikes, DO 06/23/19 [place], DO 06/23/19 1707

## 2019-06-24 LAB — RHEUMATOID FACTOR: Rhuematoid fact SerPl-aCnc: >650 IU/mL — ABNORMAL HIGH (ref 0.0–13.9)

## 2019-06-24 LAB — ANTINUCLEAR ANTIBODIES, IFA: ANA Ab, IFA: NEGATIVE

## 2019-06-25 LAB — CYCLIC CITRUL PEPTIDE ANTIBODY, IGG/IGA: CCP Antibodies IgG/IgA: 233 units — ABNORMAL HIGH (ref 0–19)

## 2019-06-26 ENCOUNTER — Telehealth: Payer: Self-pay | Admitting: Emergency Medicine

## 2019-06-26 NOTE — Telephone Encounter (Signed)
Attempted to reach patient and notify him of his lab results. Patient mailbox was full. When patient returns call please notify him of the following:  Please inform patient of elevated CCP and RA (suggestive of rheumatoid arthritis). Needs referral to rheumatology (from PCP).

## 2019-07-05 ENCOUNTER — Other Ambulatory Visit: Payer: Self-pay | Admitting: Family Medicine

## 2019-07-15 ENCOUNTER — Other Ambulatory Visit: Payer: Self-pay

## 2019-07-15 ENCOUNTER — Ambulatory Visit: Payer: 59 | Admitting: Podiatry

## 2019-07-15 DIAGNOSIS — M2142 Flat foot [pes planus] (acquired), left foot: Secondary | ICD-10-CM | POA: Diagnosis not present

## 2019-07-15 DIAGNOSIS — M722 Plantar fascial fibromatosis: Secondary | ICD-10-CM

## 2019-07-15 DIAGNOSIS — M2141 Flat foot [pes planus] (acquired), right foot: Secondary | ICD-10-CM | POA: Diagnosis not present

## 2019-07-15 DIAGNOSIS — M21961 Unspecified acquired deformity of right lower leg: Secondary | ICD-10-CM | POA: Diagnosis not present

## 2019-07-15 DIAGNOSIS — M21962 Unspecified acquired deformity of left lower leg: Secondary | ICD-10-CM

## 2019-07-16 ENCOUNTER — Ambulatory Visit (INDEPENDENT_AMBULATORY_CARE_PROVIDER_SITE_OTHER): Payer: 59 | Admitting: Orthotics

## 2019-07-16 ENCOUNTER — Other Ambulatory Visit: Payer: Self-pay

## 2019-07-16 ENCOUNTER — Encounter: Payer: Self-pay | Admitting: Podiatry

## 2019-07-16 DIAGNOSIS — M21962 Unspecified acquired deformity of left lower leg: Secondary | ICD-10-CM

## 2019-07-16 DIAGNOSIS — M21961 Unspecified acquired deformity of right lower leg: Secondary | ICD-10-CM

## 2019-07-16 DIAGNOSIS — M722 Plantar fascial fibromatosis: Secondary | ICD-10-CM

## 2019-07-16 NOTE — Progress Notes (Signed)

## 2019-07-16 NOTE — Progress Notes (Signed)
Subjective:  Patient ID: Casey Townsend, male    DOB: 1980-11-07,  MRN: [id number]  Chief Complaint  Patient presents with  . Foot Pain    pt is here for bil foot pain, pt states that he is not getting better since the last time he was here, pt also states that he is concerned with the medial side of the left heel still hurting.    39 y.o. male presents with the above complaint.  Patient presents with recurrent bilateral plantar fasciitis.  Patient was last seen by Dr. Al Corpus who treated him for plantar fasciitis which gave him relief for quite some time however over the last couple of weeks it has progressively gotten worse and the pain came back.  Patient has gone return back to work and still toe boots without any proper or adequate support.  Patient states pain is elevated when working.  He is a diabetic with last unknown A1c.  He states the injection that he received last time has helped tremendously.  He still has his plantar fascial brace that was given to him during last visit by Dr. Al Corpus.  He denies any other acute complaints.   Review of Systems: Negative except as noted in the HPI. Denies N/V/F/Ch.  Past Medical History:  Diagnosis Date  . Diabetes mellitus without complication (HCC)   . Hyperlipidemia   . Hypertension   . Vitamin B 12 deficiency     Current Outpatient Medications:  .  amLODipine (NORVASC) 10 MG tablet, Take 10 mg by mouth daily. , Disp: , Rfl:  .  diclofenac (VOLTAREN) 75 MG EC tablet, Take 1 tablet (75 mg total) by mouth 2 (two) times daily as needed for moderate pain., Disp: 30 tablet, Rfl: 0 .  empagliflozin (JARDIANCE) 10 MG TABS tablet, Take by mouth., Disp: , Rfl:  .  hydrochlorothiazide (HYDRODIURIL) 25 MG tablet, , Disp: , Rfl:  .  HYDROcodone-acetaminophen (NORCO/VICODIN) 5-325 MG tablet, 1-2 tabs po qd prn, Disp: 6 tablet, Rfl: 0 .  losartan (COZAAR) 100 MG tablet, , Disp: , Rfl:  .  metFORMIN (GLUCOPHAGE) 1000 MG tablet, Take 1,000 mg by mouth 2  (two) times daily., Disp: , Rfl:  .  Multiple Vitamin (MULTI-VITAMIN) tablet, Take by mouth., Disp: , Rfl:  .  ONETOUCH VERIO test strip, , Disp: , Rfl:   Social History   Tobacco Use  Smoking Status Never Smoker  Smokeless Tobacco Never Used    Allergies  Allergen Reactions  . Lisinopril Anaphylaxis   Objective:  There were no vitals filed for this visit. There is no height or weight on file to calculate BMI. Constitutional Well developed. Well nourished.  Vascular Dorsalis pedis pulses palpable bilaterally. Posterior tibial pulses palpable bilaterally. Capillary refill normal to all digits.  No cyanosis or clubbing noted. Pedal hair growth normal.  Neurologic Normal speech. Oriented to person, place, and time. Epicritic sensation to light touch grossly present bilaterally.  Dermatologic Nails well groomed and normal in appearance. No open wounds. No skin lesions.  Orthopedic: Normal joint ROM without pain or crepitus bilaterally. No visible deformities. Tender to palpation at the calcaneal tuber bilaterally. No pain with calcaneal squeeze bilaterally. Ankle ROM full range of motion bilaterally. Silfverskiold Test: negative bilaterally.   Radiographs: Taken and reviewed. No acute fractures or dislocations. No evidence of stress fracture.  Plantar heel spur absent. Posterior heel spur absent.   Assessment:   1. Plantar fasciitis of right foot   2. Plantar fasciitis of left foot  3. Foot deformity, bilateral   4. Pes planus of both feet    Plan:  Patient was evaluated and treated and all questions answered.  Plantar Fasciitis, bilaterally - XR reviewed as above.  - Educated on icing and stretching. Instructions given.  - Injection delivered to the plantar fascia as below. - DME: I have encouraged him to place himself back in plantar fascial brace. - Pharmacologic management: None    Semiflexible pes planus/foot deformity bilaterally -I explained to the  patient the etiology of pes planus deformity in relationship to plantar fasciitis and various treatment options were discussed extensively with the patient.  I believe patient will benefit from orthotics management to help control the hindfoot motion as well as support the arch of the foot therefore taken the stress away from the plantar fascia.  Patient will be scheduled to see [facility] for custom-made orthotics.  Procedure: Injection Tendon/Ligament Location: Bilateral plantar fascia at the glabrous junction; medial approach. Skin Prep: alcohol Injectate: 0.5 cc 0.5% marcaine plain, 0.5 cc of 1% Lidocaine, 0.5 cc kenalog 10. Disposition: Patient tolerated procedure well. Injection site dressed with a band-aid.  Return in about 4 weeks (around 08/12/2019), or see dr. Posey Pronto, for See Liliane Channel for orthotics ASAP.

## 2019-07-19 ENCOUNTER — Ambulatory Visit: Payer: 59 | Attending: Internal Medicine

## 2019-07-19 DIAGNOSIS — Z23 Encounter for immunization: Secondary | ICD-10-CM

## 2019-07-19 NOTE — Progress Notes (Signed)
   Covid-19 Vaccination Clinic  Name:  Harlo Fabela    MRN: [id number] DOB: 04/07/1981  07/19/2019  Mr. Hubert was observed post Covid-19 immunization for 15 minutes without incident. He was provided with Vaccine Information Sheet and instruction to access the V-Safe system.   Mr. Cubit was instructed to call 911 with any severe reactions post vaccine: Marland Kitchen Difficulty breathing  . Swelling of face and throat  . A fast heartbeat  . A bad rash all over body  . Dizziness and weakness   Immunizations Administered    Name Date Dose VIS Date Route   Pfizer COVID-19 Vaccine 07/19/2019  6:26 PM 0.3 mL 03/21/2019 Intramuscular   Manufacturer: [employer], [employer]   Lot: [id number]   NDC: [address]-0

## 2019-07-22 ENCOUNTER — Ambulatory Visit
Admission: EM | Admit: 2019-07-22 | Discharge: 2019-07-22 | Disposition: A | Discharge status: Home / Self Care | Payer: 59 | Attending: Family Medicine | Admitting: Family Medicine

## 2019-07-22 ENCOUNTER — Ambulatory Visit (INDEPENDENT_AMBULATORY_CARE_PROVIDER_SITE_OTHER): Payer: 59

## 2019-07-22 ENCOUNTER — Other Ambulatory Visit: Payer: Self-pay

## 2019-07-22 ENCOUNTER — Encounter: Payer: Self-pay | Admitting: Emergency Medicine

## 2019-07-22 DIAGNOSIS — M25461 Effusion, right knee: Secondary | ICD-10-CM

## 2019-07-22 DIAGNOSIS — M25561 Pain in right knee: Secondary | ICD-10-CM

## 2019-07-22 DIAGNOSIS — X500XXA Overexertion from strenuous movement or load, initial encounter: Secondary | ICD-10-CM

## 2019-07-22 MED ORDER — MELOXICAM 15 MG PO TABS: 15.0000 mg | ORAL_TABLET | Freq: Every day | ORAL | 0 refills | Status: DC

## 2019-07-22 NOTE — ED Provider Notes (Signed)
[place], [place]   Name: Casey Townsend DOB: 13-Feb-1981 MRN: [id number] CSN: [id number] PCP: Patient, No Pcp Per  Arrival date and time:  07/22/19 0805  Chief Complaint:  Knee Pain (right)  NOTE: Prior to seeing the patient today, I have reviewed the triage nursing documentation and vital signs. Clinical staff has updated patient's PMH/PSHx, current medication list, and drug allergies/intolerances to ensure comprehensive history available to assist in medical decision making.   History:   HPI: Casey Townsend is a 39 y.o. male who presents today with complaints of pain in his RIGHT knee that has been bothering him for the last 3 weeks. 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 The following day, patient reports that he had bruising that extended from the anterior knee that extended proximally to his thigh area. Residual ecchymosis noted today. Patient has appreciated pain and swelling in his knee since the incident. Pain is worse with flexion and extension of the knee. He denies previous injuries or surgeries. In efforts to conservatively manage his symptoms at home, the patient notes that he has used IBU, which has helped to improve his symptoms.    Past Medical History:  Diagnosis Date  . Diabetes mellitus without complication (HCC)   . Hyperlipidemia   . Hypertension   . Vitamin B 12 deficiency     Past Surgical History:  Procedure Laterality Date  . NO PAST SURGERIES      Family History  Problem Relation Age of Onset  . Hypertension Mother   . Hyperlipidemia Mother   . Diabetes Mother   . Other Father        homicide    Social History   Tobacco Use  . Smoking status: Never Smoker  . Smokeless tobacco: Never Used  Substance Use Topics  . Alcohol use: Yes    Alcohol/week: 2.0 standard drinks    Types: 2 Cans of beer per week  . Drug use: No    Patient Active Problem List   Diagnosis Date Noted    . Essential hypertension 11/30/2014  . Hyperlipidemia 11/30/2014  . Diabetes mellitus type 2, controlled (HCC) 11/30/2014  . Obesity 11/30/2014  . B12 deficiency 02/16/2014    Home Medications:    Current Meds  Medication Sig  . amLODipine (NORVASC) 10 MG tablet Take 10 mg by mouth daily.   . empagliflozin (JARDIANCE) 10 MG TABS tablet Take by mouth.  . hydrochlorothiazide (HYDRODIURIL) 25 MG tablet   . losartan (COZAAR) 100 MG tablet   . metFORMIN (GLUCOPHAGE) 1000 MG tablet Take 1,000 mg by mouth 2 (two) times daily.  . Multiple Vitamin (MULTI-VITAMIN) tablet Take by mouth.  Letta Pate VERIO test strip     Allergies:   Lisinopril  Review of Systems (ROS):  Review of systems NEGATIVE unless otherwise noted in narrative H&P section.   Vital Signs: Today's Vitals   07/22/19 0817 07/22/19 0818 07/22/19 0820  BP:   (!) 149/107  Pulse:   100  Resp:   18  Temp:   98.2 F (36.8 C)  TempSrc:   Oral  SpO2:   98%  Weight:  220 lb (99.8 kg)   Height:  5\' 1"  (1.549 m)   PainSc: 5       Physical Exam: Physical Exam  Constitutional: He is oriented to person, place, and time and well-developed, well-nourished, and in no distress.  HENT:  Head: Normocephalic and atraumatic.  Eyes: Pupils are equal, round, and reactive to light.  Cardiovascular: Normal rate, regular rhythm, normal heart sounds and intact distal pulses.  Pulmonary/Chest: Effort normal and breath sounds normal.  Musculoskeletal:     Right knee: Swelling and ecchymosis present. No deformity, erythema or lacerations. Decreased range of motion. Tenderness present over the lateral joint line. Normal alignment and normal patellar mobility.  Neurological: He is alert and oriented to person, place, and time. Gait normal.  Skin: Skin is warm and dry. No rash noted. He is not diaphoretic.  Psychiatric: Mood, memory, affect and judgment normal.  Nursing note and vitals reviewed.   Urgent Care Treatments / Results:    Orders Placed This Encounter  Procedures  . DG Knee Complete 4 Views Right  . Apply knee brace/sleeve    LABS: PLEASE NOTE: all labs that were ordered this encounter are listed, however only abnormal results are displayed. Labs Reviewed - No data to display  EKG: -None  RADIOLOGY: DG Knee Complete 4 Views Right  Result Date: 07/22/2019 CLINICAL DATA:  Knee pain swelling for 3 weeks. EXAM: RIGHT KNEE - COMPLETE 4+ VIEW COMPARISON:  None FINDINGS: No visible fracture or sign of dislocation. No significant degenerative change. Soft tissue swelling over the anterior knee. Small joint effusion. IMPRESSION: No visible fracture or dislocation. Small joint effusion and soft tissue swelling over the anterior knee. Electronically Signed   By: Zetta Bills M.D.   On: 07/22/2019 09:13    PROCEDURES: Procedures  MEDICATIONS RECEIVED THIS VISIT: Medications - No data to display  PERTINENT CLINICAL COURSE NOTES/UPDATES:   Initial Impression / Assessment and Plan / Urgent Care Course:  Pertinent labs & imaging results that were available during my care of the patient were personally reviewed by me and considered in my medical decision making (see lab/imaging section of note for values and interpretations).  Casey Townsend is a 39 y.o. male who presents to [facility] today with complaints of Knee Pain (right)  Patient is well appearing overall in clinic today. He does not appear to be in any acute distress. Presenting symptoms (see HPI) and exam as documented above. Knee pain x 3 weeks with some mild reduced ROM. Diagnostic radiographs of the RIGHT knee revealed no acute osseous abnormalities; no fracture or dislocation. There is an anterior effusion noted. Discussed that the absence of osseous abnormalities do not rule out a more serious issues with the knee. I explained that there could still be the potential for ligamentous and/or cartilaginous injuries that are not visible on plain  radiographs. Will pursue treatment using anti-inflammatory medication (meloxicam). Patient placed in a knee sleeve for comfort and support. Recommended limited WBAT over the new few days. He was educated on complimentary modalities to help with his pain. Patient encouraged to rest, ice, and elevate his knee. Ice should be applied TID-QID for at least 15-20 minutes at a time; written information provided on today's AVS.  If note improving, patient will need to be seen for further evaluation by orthpedics. Name and office contact information provided on today's AVS for Dr. Hessie Knows. Patient advised the he will need to contact the office to schedule an appointment to be seen.   Current clinical condition warrants patient being out of work in order to recover from his current injury/illness. He was provided with the appropriate documentation to provide to his place of employment that will allow for him to RTW on 07/26/2019 with knee brace in place if he is still having  pain.   I have reviewed the follow up and strict return precautions for any new or worsening symptoms. Patient is aware of symptoms that would be deemed urgent/emergent, and would thus require further evaluation either here or in the emergency department. At the time of discharge, he verbalized understanding and consent with the discharge plan as it was reviewed with him. All questions were fielded by provider and/or clinic staff prior to patient discharge.    Final Clinical Impressions / Urgent Care Diagnoses:   Final diagnoses:  Acute pain of right knee  Effusion of right knee    New Prescriptions:  [place] Controlled Substance Registry consulted? Not Applicable  Meds ordered this encounter  Medications  . meloxicam (MOBIC) 15 MG tablet    Sig: Take 1 tablet (15 mg total) by mouth daily.    Dispense:  30 tablet    Refill:  0    Recommended Follow up Care:  Patient encouraged to follow up with the following provider within the  specified time frame, or sooner as dictated by the severity of his symptoms. As always, he was instructed that for any urgent/emergent care needs, he should seek care either here or in the emergency department for more immediate evaluation.  Follow-up Information    Kennedy Bucker, MD In 1 week.   Specialty: Orthopedic Surgery Why: General reassessment of symptoms if not improving Contact information: [address] [facility] [place] [address] [phone]         NOTE: This note was prepared using Dragon dictation software along with smaller phrase technology. Despite my best ability to proofread, there is the potential that transcriptional errors may still occur from this process, and are completely unintentional.    Verlee Monte, NP 07/23/19 2144

## 2019-07-22 NOTE — Discharge Instructions (Signed)
It was very nice seeing you today in clinic. Thank you for entrusting me with your care.   Xray looks normal, however remember that this does nto rule out something more serious such as a ligament or cartilage issue. Rest, ice, and elevate you knee. Wear knee sleeve. Take medication for inflammation.   Make arrangements to follow up with orthopedics doctor in 1 week for re-evaluation if not improving. I have provided you the name and office contact information for an excellent local provider. If your symptoms/condition worsens, please seek follow up care either here or in the ER. Please remember, our [facility] providers are "right here with you" when you need [place].   Again, it was my pleasure to take care of you today. Thank you for choosing our clinic. I hope that you start to feel better quickly.   Quentin Mulling, MSN, APRN, FNP-C, CEN Advanced Practice Provider [facility] [place] [facility]

## 2019-07-22 NOTE — ED Triage Notes (Signed)
Pt c/o right knee pain. Started about 3 weeks ago. He states that he was stepping up on to a machine and felt a burning sensation in his knee then he had bruising in the area that he has pain and swelling now. He states pain is worse when he bends his knee.

## 2019-08-02 ENCOUNTER — Encounter: Payer: Self-pay | Admitting: Emergency Medicine

## 2019-08-02 ENCOUNTER — Other Ambulatory Visit: Payer: Self-pay

## 2019-08-02 ENCOUNTER — Ambulatory Visit
Admission: EM | Admit: 2019-08-02 | Discharge: 2019-08-02 | Disposition: A | Discharge status: Home / Self Care | Payer: 59 | Attending: Family Medicine | Admitting: Family Medicine

## 2019-08-02 DIAGNOSIS — H6022 Malignant otitis externa, left ear: Secondary | ICD-10-CM

## 2019-08-02 MED ORDER — NEOMYCIN-POLYMYXIN-HC 3.5-10000-1 OT SUSP: 4.0000 [drp] | Freq: Three times a day (TID) | OTIC | 0 refills | Status: DC

## 2019-08-02 NOTE — ED Triage Notes (Signed)
Patient c/o left ear pain that started on Monday.  Patient denies fevers.  

## 2019-08-02 NOTE — ED Provider Notes (Signed)
[facility]    CSN: [id number] Arrival date & time: 08/02/19  0805      History   Chief Complaint Chief Complaint  Patient presents with  . Otalgia    left    HPI Casey Townsend is a 39 y.o. male.   39 yo male with a c/o left ear pain for the past 6 days. States he's noticed some slight drainage as well. Denies any injuries, fevers, chills, nasal congestion, runny nose, cough. States he uses ear plugs a lot due to working in loud noise conditions.    Otalgia   Past Medical History:  Diagnosis Date  . Diabetes mellitus without complication (HCC)   . Hyperlipidemia   . Hypertension   . Vitamin B 12 deficiency     Patient Active Problem List   Diagnosis Date Noted  . Essential hypertension 11/30/2014  . Hyperlipidemia 11/30/2014  . Diabetes mellitus type 2, controlled (HCC) 11/30/2014  . Obesity 11/30/2014  . B12 deficiency 02/16/2014    Past Surgical History:  Procedure Laterality Date  . NO PAST SURGERIES         Home Medications    Prior to Admission medications   Medication Sig Start Date End Date Taking? Authorizing Provider  amLODipine (NORVASC) 10 MG tablet Take 10 mg by mouth daily.  11/20/14  Yes [provider]  hydrochlorothiazide (HYDRODIURIL) 25 MG tablet  11/18/18  Yes [provider]  losartan (COZAAR) 100 MG tablet  11/13/18  Yes [provider]  meloxicam (MOBIC) 15 MG tablet Take 1 tablet (15 mg total) by mouth daily. 07/22/19  Yes Verlee Monte, NP  metFORMIN (GLUCOPHAGE) 1000 MG tablet Take 1,000 mg by mouth 2 (two) times daily. 05/17/19  Yes [provider]  Multiple Vitamin (MULTI-VITAMIN) tablet Take by mouth.   Yes [provider]  empagliflozin (JARDIANCE) 10 MG TABS tablet Take by mouth. 07/20/17   [provider]  neomycin-polymyxin-hydrocortisone (CORTISPORIN) 3.5-10000-1 OTIC suspension Place 4 drops into the left ear 3 (three) times daily. 08/02/19   Payton Mccallum, MD    [facility] VERIO test strip  10/23/14   [provider]  pravastatin (PRAVACHOL) 10 MG tablet Take 10 mg by mouth every other day.  11/04/14 06/23/19  [provider]    Family History Family History  Problem Relation Age of Onset  . Hypertension Mother   . Hyperlipidemia Mother   . Diabetes Mother   . Other Father        homicide    Social History Social History   Tobacco Use  . Smoking status: Never Smoker  . Smokeless tobacco: Never Used  Substance Use Topics  . Alcohol use: Yes    Alcohol/week: 2.0 standard drinks    Types: 2 Cans of beer per week  . Drug use: No     Allergies   Lisinopril   Review of Systems Review of Systems  HENT: Positive for ear pain.      Physical Exam Triage Vital Signs ED Triage Vitals  Enc Vitals Group     BP 08/02/19 0816 121/86     Pulse Rate 08/02/19 0816 83     Resp 08/02/19 0816 16     Temp 08/02/19 0816 98.1 F (36.7 C)     Temp Source 08/02/19 0816 Oral     SpO2 08/02/19 0816 99 %     Weight 08/02/19 0814 220 lb (99.8 kg)     Height 08/02/19 0814 5\' 1"  (1.549 m)  Head Circumference --      Peak Flow --      Pain Score 08/02/19 0814 8     Pain Loc --      Pain Edu? --      Excl. in [place]? --    No data found.  Updated Vital Signs BP 121/86 (BP Location: Left Arm)   Pulse 83   Temp 98.1 F (36.7 C) (Oral)   Resp 16   Ht 5\' 1"  (1.549 m)   Wt 99.8 kg   SpO2 99%   BMI 41.57 kg/m   Visual Acuity Right Eye Distance:   Left Eye Distance:   Bilateral Distance:    Right Eye Near:   Left Eye Near:    Bilateral Near:     Physical Exam Vitals and nursing note reviewed.  Constitutional:      General: He is not in acute distress.    Appearance: He is not toxic-appearing or diaphoretic.  HENT:     Right Ear: Tympanic membrane and ear canal normal.     Ears:     Comments: Left ear canal swollen, erythematous with drainage    Nose: No congestion or rhinorrhea.      UC Treatments / Results   Labs (all labs ordered are listed, but only abnormal results are displayed) Labs Reviewed - No data to display  EKG   Radiology No results found.  Procedures Procedures (including critical care time)  Medications Ordered in UC Medications - No data to display  Initial Impression / Assessment and Plan / UC Course  I have reviewed the triage vital signs and the nursing notes.  Pertinent labs & imaging results that were available during my care of the patient were reviewed by me and considered in my medical decision making (see chart for details).      Final Clinical Impressions(s) / UC Diagnoses   Final diagnoses:  Acute malignant otitis externa of left ear    ED Prescriptions    Medication Sig Dispense Auth. Provider   neomycin-polymyxin-hydrocortisone (CORTISPORIN) 3.5-10000-1 OTIC suspension Place 4 drops into the left ear 3 (three) times daily. 10 mL Norval Gable, MD      1. diagnosis reviewed with patient 2. rx as per orders above; reviewed possible side effects, interactions, risks and benefits  3. Recommend supportive treatment with otc analgesics prn. 4. Follow-up prn if symptoms worsen or don't improve   PDMP not reviewed this encounter.   Norval Gable, MD 08/02/19 [phone]

## 2019-08-12 ENCOUNTER — Other Ambulatory Visit: Payer: Self-pay

## 2019-08-12 ENCOUNTER — Ambulatory Visit (INDEPENDENT_AMBULATORY_CARE_PROVIDER_SITE_OTHER): Payer: 59 | Admitting: Podiatry

## 2019-08-12 DIAGNOSIS — M21961 Unspecified acquired deformity of right lower leg: Secondary | ICD-10-CM

## 2019-08-12 DIAGNOSIS — M21962 Unspecified acquired deformity of left lower leg: Secondary | ICD-10-CM

## 2019-08-12 DIAGNOSIS — M722 Plantar fascial fibromatosis: Secondary | ICD-10-CM | POA: Diagnosis not present

## 2019-08-13 ENCOUNTER — Encounter: Payer: Self-pay | Admitting: Podiatry

## 2019-08-13 ENCOUNTER — Ambulatory Visit: Payer: 59 | Admitting: Orthotics

## 2019-08-13 ENCOUNTER — Other Ambulatory Visit: Payer: Self-pay

## 2019-08-13 DIAGNOSIS — M722 Plantar fascial fibromatosis: Secondary | ICD-10-CM

## 2019-08-13 NOTE — Progress Notes (Signed)
Subjective:  Patient ID: Casey Townsend, male    DOB: [birth year],  MRN: [id number]  Chief Complaint  Patient presents with  . Foot Pain    pt states that the right foot is doing a lot better, pt also states that the right foot has no pain, but states that he has an aching sensation after work.    39 y.o. male presents with the above complaint.  Patient presents with bilateral plantar fasciitis follow-up.  He states that he is doing really well.  He does not have any pain anymore.  The steroid injection completely helped resolve the pain.  He is doing well with bracing as well.  He states his pain scale 0 out of 10.   Review of Systems: Negative except as noted in the HPI. Denies N/V/F/Ch.  Past Medical History:  Diagnosis Date  . Diabetes mellitus without complication ([place])   . Hyperlipidemia   . Hypertension   . Vitamin B 12 deficiency     Current Outpatient Medications:  .  amLODipine (NORVASC) 10 MG tablet, Take 10 mg by mouth daily. , Disp: , Rfl:  .  amoxicillin-clavulanate (AUGMENTIN) 875-125 MG tablet, Take 1 tablet by mouth 2 (two) times daily., Disp: , Rfl:  .  empagliflozin (JARDIANCE) 10 MG TABS tablet, Take by mouth., Disp: , Rfl:  .  hydrochlorothiazide (HYDRODIURIL) 25 MG tablet, , Disp: , Rfl:  .  losartan (COZAAR) 100 MG tablet, , Disp: , Rfl:  .  meloxicam (MOBIC) 15 MG tablet, Take 1 tablet (15 mg total) by mouth daily., Disp: 30 tablet, Rfl: 0 .  metFORMIN (GLUCOPHAGE) 1000 MG tablet, Take 1,000 mg by mouth 2 (two) times daily., Disp: , Rfl:  .  Multiple Vitamin (MULTI-VITAMIN) tablet, Take by mouth., Disp: , Rfl:  .  neomycin-polymyxin-hydrocortisone (CORTISPORIN) 3.5-10000-1 OTIC suspension, Place 4 drops into the left ear 3 (three) times daily., Disp: 10 mL, Rfl: 0 .  ONETOUCH VERIO test strip, , Disp: , Rfl:   Social History   Tobacco Use  Smoking Status Never Smoker  Smokeless Tobacco Never Used    Allergies  Allergen Reactions  . Lisinopril  Anaphylaxis   Objective:  There were no vitals filed for this visit. There is no height or weight on file to calculate BMI. Constitutional Well developed. Well nourished.  Vascular Dorsalis pedis pulses palpable bilaterally. Posterior tibial pulses palpable bilaterally. Capillary refill normal to all digits.  No cyanosis or clubbing noted. Pedal hair growth normal.  Neurologic Normal speech. Oriented to person, place, and time. Epicritic sensation to light touch grossly present bilaterally.  Dermatologic Nails well groomed and normal in appearance. No open wounds. No skin lesions.  Orthopedic: Normal joint ROM without pain or crepitus bilaterally. No visible deformities. Tender to palpation at the calcaneal tuber bilaterally. No pain with calcaneal squeeze bilaterally. Ankle ROM full range of motion bilaterally. Silf[place]old Test: negative bilaterally.   Radiographs: Taken and reviewed. No acute fractures or dislocations. No evidence of stress fracture.  Plantar heel spur absent. Posterior heel spur absent.   Assessment:   1. Plantar fasciitis of right foot   2. Plantar fasciitis of left foot   3. Foot deformity, bilateral    Plan:  Patient was evaluated and treated and all questions answered.  Plantar Fasciitis, bilaterally -His plantar fasciitis pain is clinically resolved.  He states the steroid injection made the pain go away completely.  He does not have any pain on palpation to the area.  I will hold off  on any further injection.  I discussed with him long-term management of plantar fasciitis and its relationship with orthotics. -I discussed with the patient that if any foot and ankle issues arise in the future come back and see me.   Semiflexible pes planus/foot deformity bilaterally -I explained to the patient the etiology of pes planus deformity in relationship to plantar fasciitis and various treatment options were discussed extensively with the patient.  I  believe patient will benefit from orthotics management to help control the hindfoot motion as well as support the arch of the foot therefore taken the stress away from the plantar fascia.   -Patient will be picking up his orthotics tomorrow  No follow-ups on file.

## 2019-08-13 NOTE — Progress Notes (Signed)
Patient came in today to pick up custom made foot orthotics.  The goals were accomplished and the patient reported no dissatisfaction with said orthotics.  Patient was advised of breakin period and how to report any issues. 

## 2019-12-08 ENCOUNTER — Ambulatory Visit
Admission: EM | Admit: 2019-12-08 | Discharge: 2019-12-08 | Disposition: A | Discharge status: Home / Self Care | Payer: 59 | Attending: Emergency Medicine | Admitting: Emergency Medicine

## 2019-12-08 ENCOUNTER — Other Ambulatory Visit: Payer: Self-pay

## 2019-12-08 DIAGNOSIS — J069 Acute upper respiratory infection, unspecified: Secondary | ICD-10-CM | POA: Diagnosis not present

## 2019-12-08 DIAGNOSIS — Z20822 Contact with and (suspected) exposure to covid-19: Secondary | ICD-10-CM | POA: Insufficient documentation

## 2019-12-08 LAB — SARS CORONAVIRUS 2 (TAT 6-24 HRS): SARS Coronavirus 2: NEGATIVE

## 2019-12-08 MED ORDER — BENZONATATE 200 MG PO CAPS: 200.0000 mg | ORAL_CAPSULE | Freq: Three times a day (TID) | ORAL | 0 refills | Status: DC | PRN

## 2019-12-08 MED ORDER — IBUPROFEN 600 MG PO TABS: 600.0000 mg | ORAL_TABLET | Freq: Four times a day (QID) | ORAL | 0 refills | Status: DC | PRN

## 2019-12-08 NOTE — Discharge Instructions (Addendum)
COVID will be back in 24 hours.  Tessalon for the cough, 600 milligrams of ibuprofen combined with 1000 mg of Tylenol 3-4 times a day as needed for pain, Mucinex DM.

## 2019-12-08 NOTE — ED Triage Notes (Signed)
Pt with cough starting on Saturday and he can feel it in his chest.

## 2019-12-08 NOTE — ED Provider Notes (Signed)
HPI  SUBJECTIVE:  Casey Townsend is a 39 y.o. male who presents with 2 days cough productive of greenish mucus.  He reports nausea, sore throat and sternal chest soreness present only with cough.  No fevers, body aches, headaches, nasal congestion, sinus pain or pressure, postnasal drip, rhinorrhea, loss of smell or taste, wheezing, shortness of breath, vomiting, diarrhea, abdominal pain.  He states that he was exposed to two coworkers who tested positive for Covid 2 weeks ago.  He got both doses of the Covid vaccine in April.  No hemoptysis, calf pain, swelling, surgery in the past 4 weeks, recent immobilization.  No pleuritic chest pain.  No allergy or GERD symptoms.  States that he is sleeping well at night without waking up coughing.  No aggravating or alleviating factors.  He has not tried anything for this.  He has a past medical history of GERD, diabetes, hypertension.  No history of cancer, DVT, PE, pulmonary disease, smoking, pneumonia, frequent bronchitis or sinusitis.  [facility]MD: [facility].    Past Medical History:  Diagnosis Date  . Diabetes mellitus without complication (HCC)   . Hyperlipidemia   . Hypertension   . Vitamin B 12 deficiency     Past Surgical History:  Procedure Laterality Date  . NO PAST SURGERIES      Family History  Problem Relation Age of Onset  . Hypertension Mother   . Hyperlipidemia Mother   . Diabetes Mother   . Other Father        homicide    Social History   Tobacco Use  . Smoking status: Never Smoker  . Smokeless tobacco: Never Used  Vaping Use  . Vaping Use: Never used  Substance Use Topics  . Alcohol use: Yes    Alcohol/week: 2.0 standard drinks    Types: 2 Cans of beer per week  . Drug use: No    No current facility-administered medications for this encounter.  Current Outpatient Medications:  .  amLODipine (NORVASC) 10 MG tablet, Take 10 mg by mouth daily. , Disp: , Rfl:  .  benzonatate (TESSALON) 200 MG  capsule, Take 1 capsule (200 mg total) by mouth 3 (three) times daily as needed for cough., Disp: 30 capsule, Rfl: 0 .  empagliflozin (JARDIANCE) 10 MG TABS tablet, Take by mouth., Disp: , Rfl:  .  hydrochlorothiazide (HYDRODIURIL) 25 MG tablet, , Disp: , Rfl:  .  ibuprofen (ADVIL) 600 MG tablet, Take 1 tablet (600 mg total) by mouth every 6 (six) hours as needed., Disp: 30 tablet, Rfl: 0 .  metFORMIN (GLUCOPHAGE) 1000 MG tablet, Take 1,000 mg by mouth 2 (two) times daily., Disp: , Rfl:  .  Multiple Vitamin (MULTI-VITAMIN) tablet, Take by mouth., Disp: , Rfl:  .  ONETOUCH VERIO test strip, , Disp: , Rfl:   Allergies  Allergen Reactions  . Lisinopril Anaphylaxis     ROS  As noted in HPI.   Physical Exam  BP (!) 137/104 (BP Location: Left Arm)   Pulse (!) 105   Temp 98.5 F (36.9 C) (Oral)   Resp 16   Ht 5\' 1"  (1.549 m)   Wt 102.1 kg   SpO2 96%   BMI 42.51 kg/m   Constitutional: Well developed, well nourished, no acute distress Eyes:  EOMI, conjunctiva normal bilaterally HENT: Normocephalic, atraumatic,mucus membranes moist.  No nasal congestion.  No sinus tenderness.  Normal oropharynx without any obvious postnasal drip. Respiratory: Normal inspiratory effort, lungs clear bilaterally.  No  anterior, lateral chest wall tenderness Cardiovascular: Mild regular tachycardia, no new murmurs rubs or gallops GI: nondistended skin: No rash, skin intact Musculoskeletal: Calves symmetric, nontender, no edema Neurologic: Alert & oriented x 3, no focal neuro deficits Psychiatric: Speech and behavior appropriate   ED Course   Medications - No data to display  Orders Placed This Encounter  Procedures  . SARS CORONAVIRUS 2 (TAT 6-24 HRS) Nasopharyngeal Nasopharyngeal Swab    Standing Status:   Standing    Number of Occurrences:   1    Order Specific Question:   Is this test for diagnosis or screening    Answer:   Screening    Order Specific Question:   Symptomatic for COVID-19 as  defined by CDC    Answer:   No    Order Specific Question:   Hospitalized for COVID-19    Answer:   No    Order Specific Question:   Admitted to ICU for COVID-19    Answer:   No    Order Specific Question:   Previously tested for COVID-19    Answer:   No    Order Specific Question:   Resident in a congregate (group) care setting    Answer:   No    Order Specific Question:   Employed in healthcare setting    Answer:   Unknown    Order Specific Question:   Has patient completed COVID vaccination(s) (2 doses of Pfizer/Moderna 1 dose of [employer])    Answer:   Unknown    Results for orders placed or performed during the hospital encounter of 12/08/19 (from the past 24 hour(s))  SARS CORONAVIRUS 2 (TAT 6-24 HRS) Nasopharyngeal Nasopharyngeal Swab     Status: None   Collection Time: 12/08/19  3:57 PM   Specimen: Nasopharyngeal Swab  Result Value Ref Range   SARS Coronavirus 2 NEGATIVE NEGATIVE   No results found.  ED Clinical Impression  1. Viral URI with cough   2. Encounter for laboratory testing for COVID-19 virus      ED Assessment/Plan  Patient with cough, most likely infectious.  He is slightly tachycardic, but he has no pleuritic pain, he is coughing up greenish mucus, he has no risk factors for PE, doubt PE.  Afebrile, no history of fevers, no focal lung findings, doubt pneumonia.  Deferring imaging today.  He has no sinus tenderness suggestive of sinusitis.  He has no obvious postnasal drip on exam.  Covid test sent.  Home with Tessalon, Tylenol/ibuprofen, Mucinex DM, 2-day work note.  If not better after being sick for 10 days, return here and we can x-ray him and consider antibiotics.  Gave him strict ER return precautions.  If Covid positive he will be a candidate for infusion based on BMI.  Discussed MDM, treatment plan, and plan for follow-up with patient. Discussed sn/sx that should prompt return to the ED. patient agrees with plan.   Meds ordered this  encounter  Medications  . benzonatate (TESSALON) 200 MG capsule    Sig: Take 1 capsule (200 mg total) by mouth 3 (three) times daily as needed for cough.    Dispense:  30 capsule    Refill:  0  . ibuprofen (ADVIL) 600 MG tablet    Sig: Take 1 tablet (600 mg total) by mouth every 6 (six) hours as needed.    Dispense:  30 tablet    Refill:  0    *This clinic note was created using Dragon dictation  software. Therefore, there may be occasional mistakes despite careful proofreading.   ?    Domenick Gong, MD 12/09/19 [phone]

## 2020-03-22 ENCOUNTER — Ambulatory Visit: Payer: 59 | Attending: Internal Medicine

## 2020-03-22 DIAGNOSIS — Z23 Encounter for immunization: Secondary | ICD-10-CM

## 2020-03-22 NOTE — Progress Notes (Signed)
   Covid-19 Vaccination Clinic  Name:  Casey Townsend    MRN: [id number] DOB: 1980/12/22  03/22/2020  [name] was observed post Covid-19 immunization for 15 minutes without incident. He was provided with Vaccine Information Sheet and instruction to access the V-Safe system.   [name] was instructed to call 911 with any severe reactions post vaccine: Marland Kitchen Difficulty breathing  . Swelling of face and throat  . A fast heartbeat  . A bad rash all over body  . Dizziness and weakness   Immunizations Administered    Name Date Dose VIS Date Route   Pfizer COVID-19 Vaccine 03/22/2020  1:53 PM 0.3 mL 01/28/2020 Intramuscular   Manufacturer: [employer], [employer]   Lot: [id number]   NDC: [address]-1

## 2020-07-03 ENCOUNTER — Other Ambulatory Visit: Payer: Self-pay

## 2020-07-03 ENCOUNTER — Ambulatory Visit
Admission: EM | Admit: 2020-07-03 | Discharge: 2020-07-03 | Disposition: A | Discharge status: Home / Self Care | Payer: 59

## 2020-07-03 DIAGNOSIS — N481 Balanitis: Secondary | ICD-10-CM | POA: Diagnosis not present

## 2020-07-03 HISTORY — DX: Rheumatoid arthritis, unspecified: M06.9

## 2020-07-03 MED ORDER — HYDROCORTISONE 2.5 % EX LOTN: TOPICAL_LOTION | Freq: Two times a day (BID) | CUTANEOUS | 0 refills | Status: AC

## 2020-07-03 NOTE — ED Triage Notes (Signed)
Pt c/o penile irritation and foreskin swelling since Monday. Pt denies any bumps, dysuria, discharge or other symptoms. Pt did have protected this past Sunday, and these symptoms started Monday. Pt states the condom may have been a new brand.

## 2020-07-03 NOTE — ED Provider Notes (Signed)
[facility]    CSN: [id number] Arrival date & time: 07/03/20  1538      History   Chief Complaint Chief Complaint  Patient presents with  . penile irritation    HPI Casey Townsend is a 40 y.o. male.   HPI   40 year old male here for evaluation of penile irritation.  That he developed penile irritation 6 days ago.  He reports that he had sexual intercourse 7 days ago with a new brand of condom these and abuse before and then the next day he noticed that the glans of his penis his foreskin was red, swollen, and painful.  Patient denies any pain with urination discharge from his penis or pain in his testicles.  Past Medical History:  Diagnosis Date  . Diabetes mellitus without complication (HCC)   . Hyperlipidemia   . Hypertension   . Rheumatoid arthritis (HCC)   . Vitamin B 12 deficiency     Patient Active Problem List   Diagnosis Date Noted  . Essential hypertension 11/30/2014  . Hyperlipidemia 11/30/2014  . Diabetes mellitus type 2, controlled (HCC) 11/30/2014  . Obesity 11/30/2014  . B12 deficiency 02/16/2014    Past Surgical History:  Procedure Laterality Date  . NO PAST SURGERIES         Home Medications    Prior to Admission medications   Medication Sig Start Date End Date Taking? Authorizing Provider  amLODipine (NORVASC) 10 MG tablet Take 10 mg by mouth daily.  11/20/14  Yes [provider]  empagliflozin (JARDIANCE) 10 MG TABS tablet Take by mouth. 07/20/17  Yes [provider]  folic acid (FOLVITE) 1 MG tablet Take 1 mg by mouth daily. 04/17/20  Yes [provider]  hydrochlorothiazide (HYDRODIURIL) 25 MG tablet  11/18/18  Yes [provider]  hydrocortisone 2.5 % lotion Apply topically 2 (two) times daily. 07/03/20  Yes Becky Augusta, NP  losartan (COZAAR) 100 MG tablet Take 100 mg by mouth daily. 05/13/20  Yes [provider]  metFORMIN (GLUCOPHAGE) 1000 MG tablet Take 1,000 mg by mouth 2 (two)  times daily. 05/17/19  Yes [provider]  methotrexate (RHEUMATREX) 2.5 MG tablet Take 20 mg by mouth once a week. 05/10/20  Yes [provider]  Multiple Vitamin (MULTI-VITAMIN) tablet Take by mouth.   Yes [provider]  meloxicam (MOBIC) 15 MG tablet Take 15 mg by mouth daily. 06/28/20   [provider]  ONETOUCH VERIO test strip  10/23/14   [provider]  pravastatin (PRAVACHOL) 10 MG tablet Take 10 mg by mouth every other day.  11/04/14 06/23/19  [provider]    Family History Family History  Problem Relation Age of Onset  . Hypertension Mother   . Hyperlipidemia Mother   . Diabetes Mother   . Other Father        homicide    Social History Social History   Tobacco Use  . Smoking status: Never Smoker  . Smokeless tobacco: Never Used  Vaping Use  . Vaping Use: Never used  Substance Use Topics  . Alcohol use: Yes    Alcohol/week: 2.0 standard drinks    Types: 2 Cans of beer per week  . Drug use: No     Allergies   Lisinopril and Pravastatin   Review of Systems Review of Systems  Genitourinary: Positive for penile pain and penile swelling. Negative for dysuria, frequency, scrotal swelling, testicular pain and urgency.  Musculoskeletal: Negative for back pain.  Skin:  Negative.   Hematological: Negative.   Psychiatric/Behavioral: Negative.      Physical Exam Triage Vital Signs ED Triage Vitals  Enc Vitals Group     BP 07/03/20 1546 (!) 182/112     Pulse Rate 07/03/20 1546 (!) 107     Resp 07/03/20 1546 18     Temp 07/03/20 1546 98.4 F (36.9 C)     Temp Source 07/03/20 1546 Oral     SpO2 07/03/20 1546 97 %     Weight 07/03/20 1544 217 lb (98.4 kg)     Height 07/03/20 1544 5\' 1"  (1.549 m)     Head Circumference --      Peak Flow --      Pain Score 07/03/20 1544 0     Pain Loc --      Pain Edu? --      Excl. in GC? --    No data found.  Updated Vital Signs BP (!) 182/112 (BP Location: Left Arm)    Pulse (!) 107   Temp 98.4 F (36.9 C) (Oral)   Resp 18   Ht 5\' 1"  (1.549 m)   Wt 217 lb (98.4 kg)   SpO2 97%   BMI 41.00 kg/m   Visual Acuity Right Eye Distance:   Left Eye Distance:   Bilateral Distance:    Right Eye Near:   Left Eye Near:    Bilateral Near:     Physical Exam Vitals and nursing note reviewed.  Constitutional:      General: He is not in acute distress.    Appearance: Normal appearance. He is not ill-appearing.  HENT:     Head: Normocephalic and atraumatic.  Genitourinary:    Testes: Normal.  Musculoskeletal:        General: Swelling and tenderness present.  Skin:    General: Skin is warm and dry.     Capillary Refill: Capillary refill takes less than 2 seconds.     Findings: Erythema present.  Neurological:     General: No focal deficit present.     Mental Status: He is alert and oriented to person, place, and time.  Psychiatric:        Mood and Affect: Mood normal.        Behavior: Behavior normal.        Thought Content: Thought content normal.        Judgment: Judgment normal.      UC Treatments / Results  Labs (all labs ordered are listed, but only abnormal results are displayed) Labs Reviewed - No data to display  EKG   Radiology No results found.  Procedures Procedures (including critical care time)  Medications Ordered in UC Medications - No data to display  Initial Impression / Assessment and Plan / UC Course  I have reviewed the triage vital signs and the nursing notes.  Pertinent labs & imaging results that were available during my care of the patient were reviewed by me and considered in my medical decision making (see chart for details).   Patient is a 57-year-old male here for evaluation of redness and swelling to the glans of his penis and his foreskin that is also painful that occurred after having protected sex with a new brand of condom.  Patient's physical exam reveals an erythematous and mildly excoriated  foreskin and glans penis with a whitish coating that does not come off easily.  There is no induration or abscess formation.  Patient physical exam is consistent with balanitis.  This is likely due to a reaction to the spermicide and the condom he was wearing.  Will treat with topical hydrocortisone 2.5% twice daily and have him return for any new or worsening symptoms.   Final Clinical Impressions(s) / UC Diagnoses   Final diagnoses:  Balanitis     Discharge Instructions     Abstain from sexual activity laughter your inflammation has resolved.  Apply the hydrocortisone lotion twice daily to your penis and foreskin after cleaning it with warm water and soap.  If you develop any worsening redness, swelling, pain, or discharge from your penis return for reevaluation.    ED Prescriptions    Medication Sig Dispense Auth. Provider   hydrocortisone 2.5 % lotion Apply topically 2 (two) times daily. 59 mL Becky Augusta, NP     PDMP not reviewed this encounter.   Becky Augusta, NP 07/03/20 1606

## 2020-07-03 NOTE — Discharge Instructions (Addendum)
Abstain from sexual activity laughter your inflammation has resolved.  Apply the hydrocortisone lotion twice daily to your penis and foreskin after cleaning it with warm water and soap.  If you develop any worsening redness, swelling, pain, or discharge from your penis return for reevaluation.

## 2020-11-23 IMAGING — CR RIGHT MIDDLE FINGER 2+V
3 series · 3 of 3 positions shown · non-contrast
Comparison: None.

CLINICAL DATA: 37-year-old male with joint pain

EXAM:
RIGHT MIDDLE FINGER 2+V

[finger ap]
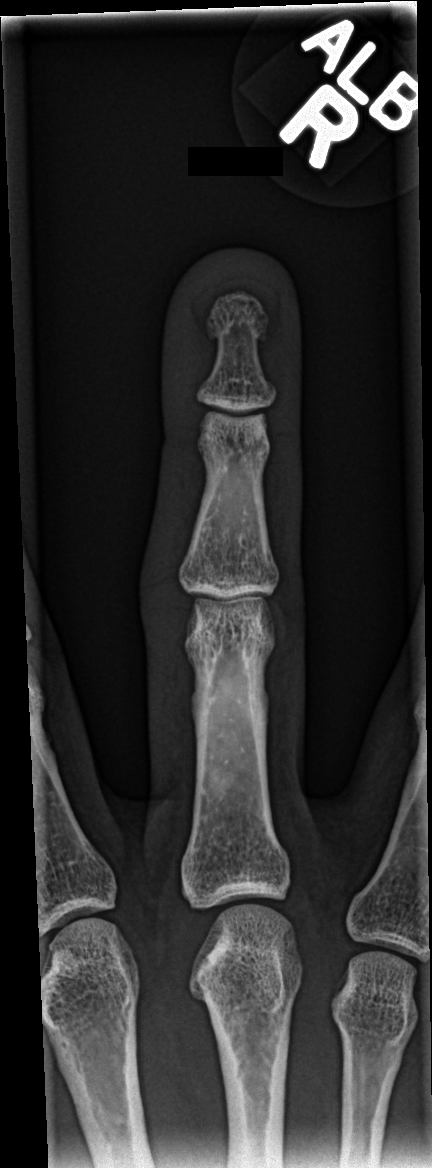

[finger obl]
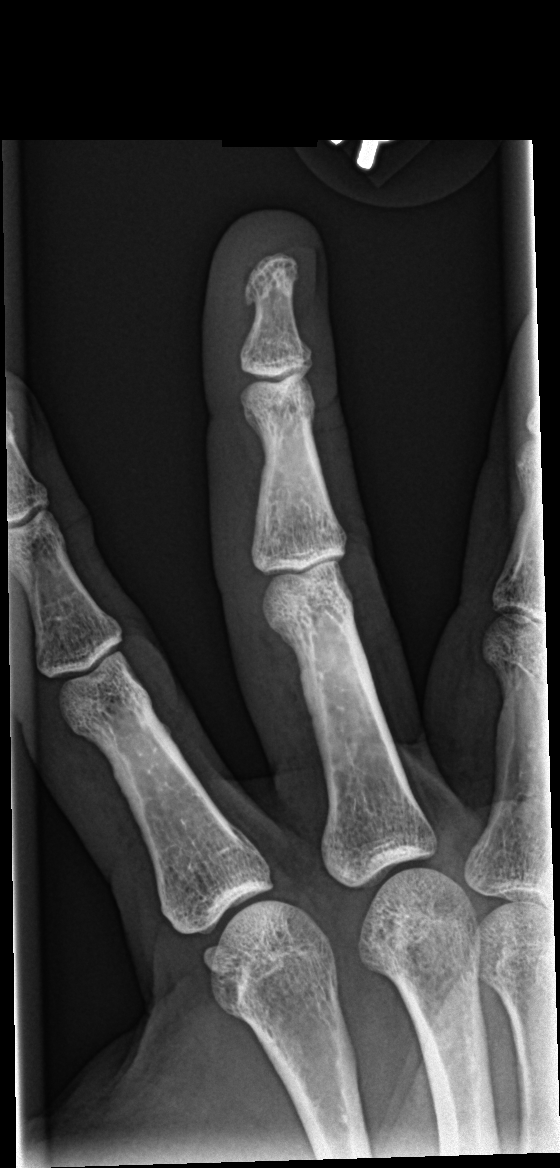

[finger lat]
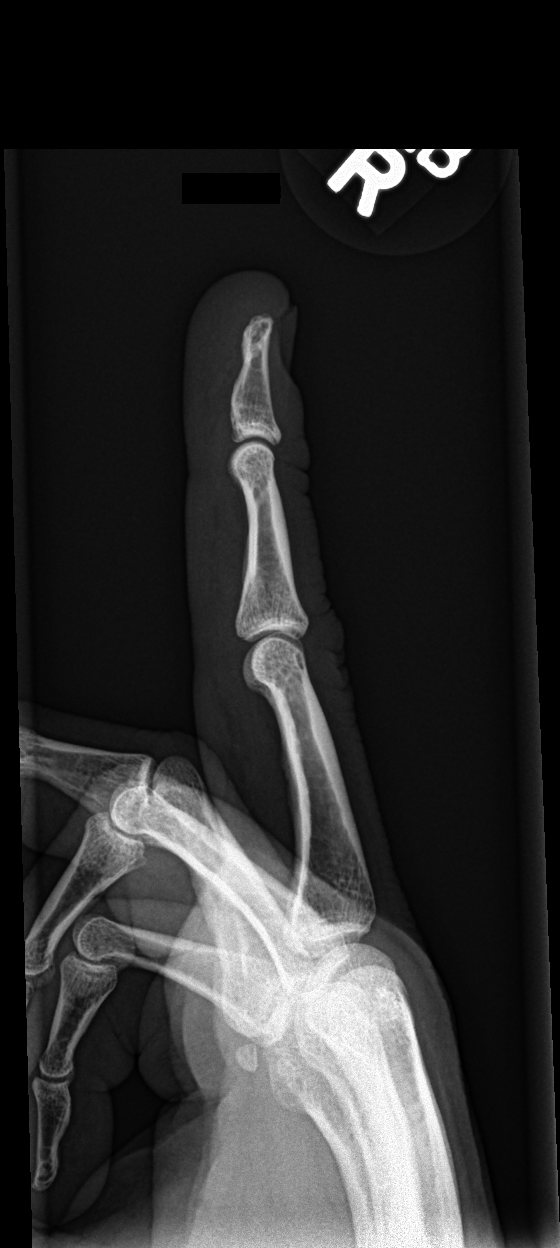

[3 of 3 positions shown; findings below may reference images not displayed]

FINDINGS: Contour asymmetry on the radial aspect of the PIP joint of the third
digit, right hand. No acute fracture. No subluxation/dislocation. No
erosive changes or significant degenerative changes. No osteopenia.
No radiopaque foreign body.
IMPRESSION: Negative for acute bony abnormality.

There is nonspecific soft tissue asymmetry along the radial aspect
of the PIP joint of third digit right hand.

## 2021-03-02 ENCOUNTER — Other Ambulatory Visit: Payer: Self-pay

## 2021-03-02 ENCOUNTER — Encounter: Payer: Self-pay | Admitting: Emergency Medicine

## 2021-03-02 ENCOUNTER — Ambulatory Visit
Admission: EM | Admit: 2021-03-02 | Discharge: 2021-03-02 | Disposition: A | Discharge status: Home / Self Care | Payer: 59 | Attending: Student | Admitting: Student

## 2021-03-02 DIAGNOSIS — J0110 Acute frontal sinusitis, unspecified: Secondary | ICD-10-CM

## 2021-03-02 DIAGNOSIS — H66003 Acute suppurative otitis media without spontaneous rupture of ear drum, bilateral: Secondary | ICD-10-CM

## 2021-03-02 MED ORDER — METHYLPREDNISOLONE 4 MG PO TBPK: ORAL_TABLET | ORAL | 0 refills | Status: DC

## 2021-03-02 MED ORDER — AMOXICILLIN-POT CLAVULANATE 875-125 MG PO TABS: 1.0000 | ORAL_TABLET | Freq: Two times a day (BID) | ORAL | 0 refills | Status: DC

## 2021-03-02 NOTE — ED Provider Notes (Signed)
[facility]    CSN: [id number] Arrival date & time: 03/02/21  1738      History   Chief Complaint Chief Complaint  Patient presents with   Cough   Generalized Body Aches   Chills   Headache   Otalgia    HPI Casey Townsend is a 40 y.o. male who presents today for evaluation of nasal congestion, runny nose, headache, cough ongoing since last Friday.  The patient took a at home COVID test which was negative.  He does report fever initially at home which he states was a 102.  The patient has not been running any fevers over the past 48 hours.  He reports facial pressure, runny nose and sore throat.  He does report a productive cough.  He denies any nausea bu did experience an episode of vomiting after coughing.  The patient has been taking over-the-counter medications which include Tylenol and Robitussin for symptoms.  He does report moderate fullness in his ears he does report pain in both ears as well.  Does report some muffled hearing.   Cough Associated symptoms: ear pain, fever, headaches and rhinorrhea   Associated symptoms: no chest pain   Headache Associated symptoms: cough, drainage, ear pain and fever   Associated symptoms: no diarrhea, no nausea and no vomiting   Otalgia Associated symptoms: cough, fever, headaches and rhinorrhea   Associated symptoms: no diarrhea and no vomiting    Past Medical History:  Diagnosis Date   Diabetes mellitus without complication (HCC)    Hyperlipidemia    Hypertension    Rheumatoid arthritis (HCC)    Vitamin B 12 deficiency     Patient Active Problem List   Diagnosis Date Noted   Essential hypertension 11/30/2014   Hyperlipidemia 11/30/2014   Diabetes mellitus type 2, controlled (HCC) 11/30/2014   Obesity 11/30/2014   B12 deficiency 02/16/2014    Past Surgical History:  Procedure Laterality Date   NO PAST SURGERIES         Home Medications    Prior to Admission medications   Medication Sig Start Date  End Date Taking? Authorizing Provider  amoxicillin-clavulanate (AUGMENTIN) 875-125 MG tablet Take 1 tablet by mouth every 12 (twelve) hours. 03/02/21  Yes Anson Oregon, PA-C  methylPREDNISolone (MEDROL DOSEPAK) 4 MG TBPK tablet Take per package instructions. 03/02/21  Yes Anson Oregon, PA-C  amLODipine (NORVASC) 10 MG tablet Take 10 mg by mouth daily.  11/20/14   [provider]  empagliflozin (JARDIANCE) 10 MG TABS tablet Take by mouth. 07/20/17   [provider]  folic acid (FOLVITE) 1 MG tablet Take 1 mg by mouth daily. 04/17/20   [provider]  hydrochlorothiazide (HYDRODIURIL) 25 MG tablet  11/18/18   [provider]  hydrocortisone 2.5 % lotion Apply topically 2 (two) times daily. 07/03/20   Becky Augusta, NP  losartan (COZAAR) 100 MG tablet Take 100 mg by mouth daily. 05/13/20   [provider]  meloxicam (MOBIC) 15 MG tablet Take 15 mg by mouth daily. 06/28/20   [provider]  metFORMIN (GLUCOPHAGE) 1000 MG tablet Take 1,000 mg by mouth 2 (two) times daily. 05/17/19   [provider]  methotrexate (RHEUMATREX) 2.5 MG tablet Take 20 mg by mouth once a week. 05/10/20   [provider]  Multiple Vitamin (MULTI-VITAMIN) tablet Take by mouth.    [provider]  [facility] VERIO test strip  10/23/14   [provider]  pravastatin (PRAVACHOL) 10 MG tablet Take  10 mg by mouth every other day.  11/04/14 06/23/19  [provider]    Family History Family History  Problem Relation Age of Onset   Hypertension Mother    Hyperlipidemia Mother    Diabetes Mother    Other Father        homicide    Social History Social History   Tobacco Use   Smoking status: Never   Smokeless tobacco: Never  Vaping Use   Vaping Use: Never used  Substance Use Topics   Alcohol use: Yes    Alcohol/week: 2.0 standard drinks    Types: 2 Cans of beer per week   Drug use: No     Allergies   Lisinopril and  Pravastatin   Review of Systems Review of Systems  Constitutional:  Positive for fever.  HENT:  Positive for ear pain, postnasal drip, rhinorrhea and sinus pain.   Respiratory:  Positive for cough.   Cardiovascular:  Negative for chest pain.  Gastrointestinal:  Negative for diarrhea, nausea and vomiting.  Neurological:  Positive for headaches.  All other systems reviewed and are negative.   Physical Exam Triage Vital Signs ED Triage Vitals [03/02/21 1828]  Enc Vitals Group     BP 127/79     Pulse Rate (!) 102     Resp 18     Temp 99.3 F (37.4 C)     Temp Source Oral     SpO2 96 %     Weight      Height      Head Circumference      Peak Flow      Pain Score 0     Pain Loc      Pain Edu?      Excl. in GC?    No data found.  Updated Vital Signs BP 127/79 (BP Location: Left Arm)   Pulse (!) 102   Temp 99.3 F (37.4 C) (Oral)   Resp 18   SpO2 96%   Visual Acuity Right Eye Distance:   Left Eye Distance:   Bilateral Distance:    Right Eye Near:   Left Eye Near:    Bilateral Near:     Physical Exam Vitals reviewed.  Constitutional:      Appearance: Normal appearance. He is not ill-appearing or toxic-appearing.  HENT:     Right Ear: Tympanic membrane is injected, erythematous and bulging.     Left Ear: Tympanic membrane is injected, erythematous and bulging.     Ears:     Comments: Purulent material noted behind bilateral TM    Nose: Rhinorrhea present. Rhinorrhea is clear.     Comments: Moderate erythema to bilateral nasal canals.    Mouth/Throat:     Pharynx: No oropharyngeal exudate.     Comments: Mild erythema to posterior oropharynx. Cardiovascular:     Rate and Rhythm: Regular rhythm. Tachycardia present.     Heart sounds: Normal heart sounds, S1 normal and S2 normal. No murmur heard. Pulmonary:     Effort: Pulmonary effort is normal.     Breath sounds: Normal breath sounds. No wheezing, rhonchi or rales.  Abdominal:     General: Abdomen is  flat. Bowel sounds are normal.     Palpations: Abdomen is soft.     Tenderness: There is no abdominal tenderness.  Neurological:     Mental Status: He is alert.     UC Treatments / Results  Labs (all labs ordered are listed, but only abnormal results are  displayed) Labs Reviewed - No data to display  EKG   Radiology No results found.  Procedures Procedures (including critical care time)  Medications Ordered in UC Medications - No data to display  Initial Impression / Assessment and Plan / UC Course  I have reviewed the triage vital signs and the nursing notes.  Pertinent labs & imaging results that were available during my care of the patient were reviewed by me and considered in my medical decision making (see chart for details).     1.  Treatment options were discussed today with the patient. 2.  Moderate erythema with purulent material behind bilateral tympanic membranes. 3.  We will treat for underlying sinusitis and otitis media.  The patient was given a prescription of Augmentin to begin taking at this time. 4.  I believe his cough is likely due to nasal drainage.  We will give Medrol Dosepak to take for cough. 5.  Follow-up if symptoms fail to improve. Final Clinical Impressions(s) / UC Diagnoses   Final diagnoses:  Acute non-recurrent frontal sinusitis  Non-recurrent acute suppurative otitis media of both ears without spontaneous rupture of tympanic membranes     Discharge Instructions      -Take antibiotic as directed. -Medrol Dosepak for cough symptoms. -Continue with over-the-counter medications for symptom management.  Robitussin at night for cough. -Begin daily Zyrtec as well.    ED Prescriptions     Medication Sig Dispense Auth. Provider   amoxicillin-clavulanate (AUGMENTIN) 875-125 MG tablet Take 1 tablet by mouth every 12 (twelve) hours. 20 tablet Anson Oregon, PA-C   methylPREDNISolone (MEDROL DOSEPAK) 4 MG TBPK tablet Take per  package instructions. 21 tablet Anson Oregon, PA-C      PDMP not reviewed this encounter.   Anson Oregon, PA-C 03/02/21 1958

## 2021-03-02 NOTE — Discharge Instructions (Addendum)
-  Take antibiotic as directed. -Medrol Dosepak for cough symptoms. -Continue with over-the-counter medications for symptom management.  Robitussin at night for cough. -Begin daily Zyrtec as well.

## 2021-03-02 NOTE — ED Triage Notes (Signed)
Pt c/o cough, right side ear pain, bodyaches, chills and runny nose x 6 days

## 2021-06-30 ENCOUNTER — Ambulatory Visit
Admission: EM | Admit: 2021-06-30 | Discharge: 2021-06-30 | Disposition: A | Discharge status: Home / Self Care | Payer: 59 | Attending: Physician Assistant | Admitting: Physician Assistant

## 2021-06-30 DIAGNOSIS — I10 Essential (primary) hypertension: Secondary | ICD-10-CM | POA: Diagnosis not present

## 2021-06-30 DIAGNOSIS — Z20822 Contact with and (suspected) exposure to covid-19: Secondary | ICD-10-CM | POA: Diagnosis not present

## 2021-06-30 DIAGNOSIS — E119 Type 2 diabetes mellitus without complications: Secondary | ICD-10-CM | POA: Diagnosis not present

## 2021-06-30 DIAGNOSIS — E785 Hyperlipidemia, unspecified: Secondary | ICD-10-CM | POA: Diagnosis not present

## 2021-06-30 DIAGNOSIS — M069 Rheumatoid arthritis, unspecified: Secondary | ICD-10-CM | POA: Insufficient documentation

## 2021-06-30 DIAGNOSIS — J039 Acute tonsillitis, unspecified: Secondary | ICD-10-CM | POA: Insufficient documentation

## 2021-06-30 DIAGNOSIS — R5383 Other fatigue: Secondary | ICD-10-CM | POA: Insufficient documentation

## 2021-06-30 DIAGNOSIS — R509 Fever, unspecified: Secondary | ICD-10-CM | POA: Diagnosis present

## 2021-06-30 LAB — RESP PANEL BY RT-PCR (FLU A&B, COVID) ARPGX2
Influenza A by PCR: NEGATIVE
Influenza B by PCR: NEGATIVE
SARS Coronavirus 2 by RT PCR: NEGATIVE

## 2021-06-30 LAB — MONONUCLEOSIS SCREEN: Mono Screen: NEGATIVE

## 2021-06-30 LAB — GROUP A STREP BY PCR: Group A Strep by PCR: NOT DETECTED

## 2021-06-30 MED ORDER — PREDNISONE 20 MG PO TABS: 40.0000 mg | ORAL_TABLET | Freq: Every day | ORAL | 0 refills | Status: AC

## 2021-06-30 MED ORDER — ACETAMINOPHEN 325 MG PO TABS
650.0000 mg | ORAL_TABLET | Freq: Once | ORAL | Status: AC
  Administered 2021-06-30: 650 mg via ORAL

## 2021-06-30 MED ORDER — AMOXICILLIN-POT CLAVULANATE 875-125 MG PO TABS: 1.0000 | ORAL_TABLET | Freq: Two times a day (BID) | ORAL | 0 refills | Status: AC

## 2021-06-30 NOTE — Discharge Instructions (Signed)
-  Your strep is negative.  We have tested you for COVID, flu and mono.  I will call him to get those results.  If you have the flu I will send Tamiflu and if you have COVID I can send COVID antiviral medicine.  If you have mono that we will need to run its course but I can treat the swelling in your throat and pain.  If all this test are negative, I will send antibiotics to cover you for the possibility of a false negative strep test. ?- If you have any uncontrollable fever, weakness or you believe that the swelling in your throat has worsened or you have severe pain in your neck you should go to the ER. ?

## 2021-06-30 NOTE — ED Triage Notes (Signed)
Patient presents to Urgent Care with complaints of fever, sore throat, loss of appetite, fatigue, and chills since Tuesday. Treating symptoms with tylenol. Pt states he had a negative at home covid test yesterday.  ?

## 2021-06-30 NOTE — ED Provider Notes (Signed)
?[facility] ? ? ? ?CSN: [id number] ?Arrival date & time: 06/30/21  1511 ? ? ?  ? ?History   ?Chief Complaint ?Chief Complaint  ?Patient presents with  ? Fever  ? Chills  ? Fatigue  ? ? ?HPI ?Casey Townsend is a 41 y.o. male presenting for fever, fatigue, chills, body aches, sore throat, and loss of appetite x2 days.  He denies cough.  Has had some congestion.  Denies difficulty breathing, nausea/vomiting or diarrhea.  Denies any sick contacts or known exposure to COVID, flu or strep.  Has been taking over-the-counter antipyretics for fever but has not had any in a while.  Medical history significant for diabetes, hypertension, hyperlipidemia and rheumatoid arthritis. ? ? ?HPI ? ?Past Medical History:  ?Diagnosis Date  ? Diabetes mellitus without complication (HCC)   ? Hyperlipidemia   ? Hypertension   ? Rheumatoid arthritis (HCC)   ? Vitamin B 12 deficiency   ? ? ?Patient Active Problem List  ? Diagnosis Date Noted  ? Essential hypertension 11/30/2014  ? Hyperlipidemia 11/30/2014  ? Diabetes mellitus type 2, controlled (HCC) 11/30/2014  ? Obesity 11/30/2014  ? B12 deficiency 02/16/2014  ? ? ?Past Surgical History:  ?Procedure Laterality Date  ? NO PAST SURGERIES    ? ? ? ? ? ?Home Medications   ? ?Prior to Admission medications   ?Medication Sig Start Date End Date Taking? Authorizing Provider  ?amLODipine (NORVASC) 10 MG tablet Take 10 mg by mouth daily.  11/20/14   [provider]  ?amoxicillin-clavulanate (AUGMENTIN) 875-125 MG tablet Take 1 tablet by mouth every 12 (twelve) hours. 03/02/21   Anson Oregon, PA-C  ?empagliflozin (JARDIANCE) 10 MG TABS tablet Take by mouth. 07/20/17   [provider]  ?folic acid (FOLVITE) 1 MG tablet Take 1 mg by mouth daily. 04/17/20   [provider]  ?hydrochlorothiazide (HYDRODIURIL) 25 MG tablet  11/18/18   [provider]  ?hydrocortisone 2.5 % lotion Apply topically 2 (two) times daily. 07/03/20   Becky Augusta, NP  ?losartan  (COZAAR) 100 MG tablet Take 100 mg by mouth daily. 05/13/20   [provider]  ?meloxicam (MOBIC) 15 MG tablet Take 15 mg by mouth daily. 06/28/20   [provider]  ?metFORMIN (GLUCOPHAGE) 1000 MG tablet Take 1,000 mg by mouth 2 (two) times daily. 05/17/19   [provider]  ?methotrexate (RHEUMATREX) 2.5 MG tablet Take 20 mg by mouth once a week. 05/10/20   [provider]  ?methylPREDNISolone (MEDROL DOSEPAK) 4 MG TBPK tablet Take per package instructions. 03/02/21   Anson Oregon, PA-C  ?Multiple Vitamin (MULTI-VITAMIN) tablet Take by mouth.    [provider]  ?Lum Babe test strip  10/23/14   [provider]  ?pravastatin (PRAVACHOL) 10 MG tablet Take 10 mg by mouth every other day.  11/04/14 06/23/19  [provider]  ? ? ?Family History ?Family History  ?Problem Relation Age of Onset  ? Hypertension Mother   ? Hyperlipidemia Mother   ? Diabetes Mother   ? Other Father   ?     homicide  ? ? ?Social History ?Social History  ? ?Tobacco Use  ? Smoking status: Never  ? Smokeless tobacco: Never  ?Vaping Use  ? Vaping Use: Never used  ?Substance Use Topics  ? Alcohol use: Yes  ?  Alcohol/week: 2.0 standard drinks  ?  Types: 2 Cans of beer per week  ? Drug use: No  ? ? ? ?Allergies   ?  Lisinopril and Pravastatin ? ? ?Review of Systems ?Review of Systems  ?Constitutional:  Positive for appetite change, chills, fatigue and fever.  ?HENT:  Positive for congestion, rhinorrhea and sore throat. Negative for sinus pressure, sinus pain and trouble swallowing.   ?Respiratory:  Negative for cough and shortness of breath.   ?Cardiovascular:  Negative for chest pain.  ?Gastrointestinal:  Negative for abdominal pain, diarrhea, nausea and vomiting.  ?Musculoskeletal:  Positive for myalgias.  ?Neurological:  Positive for headaches. Negative for weakness and light-headedness.  ?Hematological:  Positive for adenopathy.  ? ? ?Physical Exam ?Triage Vital Signs ?ED Triage  Vitals  ?Enc Vitals Group  ?   BP 06/30/21 1526 133/83  ?   Pulse Rate 06/30/21 1526 (!) 127  ?   Resp 06/30/21 1526 18  ?   Temp 06/30/21 1526 (!) 100.9 ?F (38.3 ?C)  ?   Temp Source 06/30/21 1526 Oral  ?   SpO2 06/30/21 1526 95 %  ?   Weight 06/30/21 1525 225 lb (102.1 kg)  ?   Height --   ?   Head Circumference --   ?   Peak Flow --   ?   Pain Score 06/30/21 1530 0  ?   Pain Loc --   ?   Pain Edu? --   ?   Excl. in GC? --   ? ?No data found. ? ?Updated Vital Signs ?BP 133/83 (BP Location: Right Arm)   Pulse (!) 127   Temp (!) 100.9 ?F (38.3 ?C) (Oral)   Resp 18   Wt 225 lb (102.1 kg)   SpO2 95%   BMI 42.51 kg/m?  ?   ? ?Physical Exam ?Vitals and nursing note reviewed.  ?Constitutional:   ?   General: He is not in acute distress. ?   Appearance: Normal appearance. He is well-developed. He is ill-appearing.  ?HENT:  ?   Head: Normocephalic and atraumatic.  ?   Nose: Congestion present.  ?   Mouth/Throat:  ?   Mouth: Mucous membranes are moist.  ?   Pharynx: Oropharynx is clear. Posterior oropharyngeal erythema present.  ?   Tonsils: Tonsillar exudate (white exudates bilaterally) present. 2+ on the right. 2+ on the left.  ?Eyes:  ?   General: No scleral icterus. ?   Conjunctiva/sclera: Conjunctivae normal.  ?Cardiovascular:  ?   Rate and Rhythm: Regular rhythm. Tachycardia present.  ?   Heart sounds: Normal heart sounds.  ?Pulmonary:  ?   Effort: Pulmonary effort is normal. No respiratory distress.  ?   Breath sounds: Normal breath sounds.  ?Musculoskeletal:  ?   Cervical back: Neck supple.  ?Lymphadenopathy:  ?   Cervical: Cervical adenopathy present.  ?Skin: ?   General: Skin is warm and dry.  ?   Capillary Refill: Capillary refill takes less than 2 seconds.  ?Neurological:  ?   General: No focal deficit present.  ?   Mental Status: He is alert. Mental status is at baseline.  ?   Motor: No weakness.  ?   Coordination: Coordination normal.  ?   Gait: Gait normal.  ?Psychiatric:     ?   Mood and Affect: Mood  normal.     ?   Behavior: Behavior normal.     ?   Thought Content: Thought content normal.  ? ? ? ?UC Treatments / Results  ?Labs ?(all labs ordered are listed, but only abnormal results are displayed) ?Labs Reviewed  ?GROUP A STREP BY PCR  ?  RESP PANEL BY RT-PCR (FLU A&B, COVID) [id number]  ?MONONUCLEOSIS SCREEN  ? ? ?EKG ? ? ?Radiology ?No results found. ? ?Procedures ?Procedures (including critical care time) ? ?Medications Ordered in UC ?Medications  ?acetaminophen (TYLENOL) tablet 650 mg (650 mg Oral Given 06/30/21 1535)  ? ? ?Initial Impression / Assessment and Plan / UC Course  ?I have reviewed the triage vital signs and the nursing notes. ? ?Pertinent labs & imaging results that were available during my care of the patient were reviewed by me and considered in my medical decision making (see chart for details). ? ?41 year old male presenting for fever, fatigue, body aches, chills, sore throat x2 days.  Temp is elevated 100.9 degrees and patient given Tylenol.  Pulse elevated at 127 bpm.  He is ill-appearing and mildly diaphoretic.  He is nontoxic.  On exam he does have nasal congestion, posterior pharyngeal erythema with 2+ bilateral tonsillar swelling and whitish exudates as well as tender and enlarged anterior cervical lymph nodes.  Chest is clear to auscultation. ? ?PCR strep test is negative. ?Mono obtained.  Negative. ?Respiratory panel obtained to assess for possibility of flu and COVID.  Negative. ? ?Discussed all results with patient.  Given that he has a temp to 101 degrees, is tachycardic and has 2+ enlarged tonsils with exudates and anterior cervical lymphadenopathy, I suspect the PCR strep test is a false negative.  We will treat him for suspected strep with Augmentin.  Also sent prednisone to help with the tonsil swelling and encouraged rest, fluids and supportive care.  Reviewed return and ER precautions with patient. ? ?Final Clinical Impressions(s) / UC Diagnoses  ? ?Final diagnoses:  ?Acute  tonsillitis, unspecified etiology  ?Fever, unspecified  ?Other fatigue  ? ? ? ?Discharge Instructions   ? ?  ?-Your strep is negative.  We have tested you for COVID, flu and mono.  I will call him to get those re

## 2021-11-23 ENCOUNTER — Encounter: Payer: Self-pay | Admitting: Emergency Medicine

## 2021-11-23 ENCOUNTER — Ambulatory Visit
Admission: EM | Admit: 2021-11-23 | Discharge: 2021-11-23 | Disposition: A | Discharge status: Home / Self Care | Payer: 59 | Attending: Emergency Medicine | Admitting: Emergency Medicine

## 2021-11-23 DIAGNOSIS — U071 COVID-19: Secondary | ICD-10-CM | POA: Diagnosis present

## 2021-11-23 DIAGNOSIS — J019 Acute sinusitis, unspecified: Secondary | ICD-10-CM | POA: Insufficient documentation

## 2021-11-23 DIAGNOSIS — H6502 Acute serous otitis media, left ear: Secondary | ICD-10-CM | POA: Diagnosis present

## 2021-11-23 LAB — SARS CORONAVIRUS 2 BY RT PCR: SARS Coronavirus 2 by RT PCR: POSITIVE — AB

## 2021-11-23 LAB — GROUP A STREP BY PCR: Group A Strep by PCR: NOT DETECTED

## 2021-11-23 MED ORDER — MOLNUPIRAVIR EUA 200MG CAPSULE: 4.0000 | ORAL_CAPSULE | Freq: Two times a day (BID) | ORAL | 0 refills | Status: AC

## 2021-11-23 MED ORDER — FLUTICASONE PROPIONATE 50 MCG/ACT NA SUSP: 2.0000 | Freq: Every day | NASAL | 0 refills | Status: AC

## 2021-11-23 MED ORDER — AMOXICILLIN-POT CLAVULANATE 875-125 MG PO TABS: 1.0000 | ORAL_TABLET | Freq: Two times a day (BID) | ORAL | 0 refills | Status: DC

## 2021-11-23 NOTE — ED Provider Notes (Signed)
HPI  SUBJECTIVE:  Casey Townsend is a 41 y.o. male who presents with 2 weeks of nasal congestion, occasional cough productive of the same material as his nasal congestion.  Patient states that he is blowing out mucus.  He reports a left-sided sore throat and left-sided ear pain starting 2 days ago.  He has had multiple COVID contacts at work over the past week.  However, he was having symptoms prior to the COVID exposure.  No fevers, rhinorrhea, sinus pain or pressure, postnasal drip, facial swelling, upper dental pain.  No change in hearing, otorrhea, wheezing, shortness of breath.  He is able to sleep at night without waking up coughing.  No allergy symptoms.  No body aches, headaches, loss of sense of taste or smell, nausea, vomiting, diarrhea, abdominal pain, neck stiffness, sensation of throat swelling shut, voice changes, difficulty breathing.  He got 4 doses of the COVID-vaccine.  No antibiotics in the past month.  No antipyretic in the past 6 hours.  He has tried NyQuil, over-the-counter cold medications without improvement in his symptoms.  No aggravating factors.  He has a past medical history of diabetes, hypertension, rheumatoid arthritis on methotrexate, hypercholesterolemia.  He has never had COVID before.  PCP: [facility].    Past Medical History:  Diagnosis Date   Diabetes mellitus without complication (HCC)    Hyperlipidemia    Hypertension    Rheumatoid arthritis (HCC)    Vitamin B 12 deficiency     Past Surgical History:  Procedure Laterality Date   NO PAST SURGERIES      Family History  Problem Relation Age of Onset   Hypertension Mother    Hyperlipidemia Mother    Diabetes Mother    Other Father        homicide    Social History   Tobacco Use   Smoking status: Never   Smokeless tobacco: Never  Vaping Use   Vaping Use: Never used  Substance Use Topics   Alcohol use: Yes    Alcohol/week: 2.0 standard drinks of alcohol    Types: 2 Cans of beer  per week   Drug use: No    No current facility-administered medications for this encounter.  Current Outpatient Medications:    amoxicillin-clavulanate (AUGMENTIN) 875-125 MG tablet, Take 1 tablet by mouth every 12 (twelve) hours., Disp: 14 tablet, Rfl: 0   fluticasone (FLONASE) 50 MCG/ACT nasal spray, Place 2 sprays into both nostrils daily., Disp: 16 g, Rfl: 0   molnupiravir EUA (LAGEVRIO) 200 mg CAPS capsule, Take 4 capsules (800 mg total) by mouth 2 (two) times daily for 5 days., Disp: 40 capsule, Rfl: 0   amLODipine (NORVASC) 10 MG tablet, Take 10 mg by mouth daily. , Disp: , Rfl:    empagliflozin (JARDIANCE) 10 MG TABS tablet, Take by mouth., Disp: , Rfl:    folic acid (FOLVITE) 1 MG tablet, Take 1 mg by mouth daily., Disp: , Rfl:    hydrochlorothiazide (HYDRODIURIL) 25 MG tablet, , Disp: , Rfl:    hydrocortisone 2.5 % lotion, Apply topically 2 (two) times daily., Disp: 59 mL, Rfl: 0   losartan (COZAAR) 100 MG tablet, Take 100 mg by mouth daily., Disp: , Rfl:    meloxicam (MOBIC) 15 MG tablet, Take 15 mg by mouth daily., Disp: , Rfl:    metFORMIN (GLUCOPHAGE) 1000 MG tablet, Take 1,000 mg by mouth 2 (two) times daily., Disp: , Rfl:    methotrexate (RHEUMATREX) 2.5 MG tablet, Take 20 mg by mouth  once a week., Disp: , Rfl:    Multiple Vitamin (MULTI-VITAMIN) tablet, Take by mouth., Disp: , Rfl:    ONETOUCH VERIO test strip, , Disp: , Rfl:   Allergies  Allergen Reactions   Lisinopril Anaphylaxis   Pravastatin Anaphylaxis     ROS  As noted in HPI.   Physical Exam  BP (!) 141/88 (BP Location: Left Arm)   Pulse (!) 102   Temp 98.2 F (36.8 C) (Oral)   Resp 18   SpO2 95%   Constitutional: Well developed, well nourished, no acute distress Eyes:  EOMI, conjunctiva normal bilaterally HENT: Normocephalic, atraumatic,mucus membranes moist.  Left TM erythematous with serous effusion.  Right TM normal.  Purulent rhinorrhea, worse on the left.  Erythematous, swollen turbinates.  No  maxillary, frontal sinus tenderness.  Tonsils slightly enlarged, erythematous without exudates.  Uvula midline.  Positive postnasal drip Neck: Positive cervical lymphadenopathy Respiratory: Normal inspiratory effort, lungs clear bilaterally Cardiovascular: Regular tachycardia, no murmurs rubs or gallops GI: nondistended skin: No rash, skin intact Musculoskeletal: no deformities Neurologic: Alert & oriented x 3, no focal neuro deficits Psychiatric: Speech and behavior appropriate   ED Course   Medications - No data to display  Orders Placed This Encounter  Procedures   Group A Strep by PCR    Standing Status:   Standing    Number of Occurrences:   1   SARS Coronavirus 2 by RT PCR (hospital order, performed in [facility] hospital lab) *cepheid single result test* Anterior Nasal Swab    Standing Status:   Standing    Number of Occurrences:   1    Results for orders placed or performed during the hospital encounter of 11/23/21 (from the past 24 hour(s))  Group A Strep by PCR     Status: None   Collection Time: 11/23/21  3:42 PM   Specimen: Throat; Sterile Swab  Result Value Ref Range   Group A Strep by PCR NOT DETECTED NOT DETECTED  SARS Coronavirus 2 by RT PCR (hospital order, performed in [facility] hospital lab) *cepheid single result test* Throat     Status: Abnormal   Collection Time: 11/23/21  3:42 PM   Specimen: Throat; Nasal Swab  Result Value Ref Range   SARS Coronavirus 2 by RT PCR POSITIVE (A) NEGATIVE   No results found.  ED Clinical Impression  1. COVID-19 virus infection   2. Acute non-recurrent sinusitis, unspecified location   3. Non-recurrent acute serous otitis media of left ear      ED Assessment/Plan   Patient presents with an acute illness with systemic symptoms. Strep negative. COVID-positive.   Will consider today day 0 for quarantine purposes.  He is at high risk for progression to serious illness.  We discussed Paxlovid versus molnupiravir, he  has opted for molnupiravir.  He also has purulent nasal congestion that has been present for the past 2 weeks, and serous otitis media on the left side.  Will send home with Augmentin to treat both an otitis/sinusitis.  Flonase, saline nasal irrigation, Mucinex.  Advised patient to walk a little bit every day to prevent PE and pneumonia.  Follow-up with PCP as needed.  ER return precautions given.   Discussed labs,  MDM, treatment plan, and plan for follow-up with patient. Discussed sn/sx that should prompt return to the ED. patient agrees with plan.   Meds ordered this encounter  Medications   molnupiravir EUA (LAGEVRIO) 200 mg CAPS capsule    Sig: Take 4  capsules (800 mg total) by mouth 2 (two) times daily for 5 days.    Dispense:  40 capsule    Refill:  0   fluticasone (FLONASE) 50 MCG/ACT nasal spray    Sig: Place 2 sprays into both nostrils daily.    Dispense:  16 g    Refill:  0   amoxicillin-clavulanate (AUGMENTIN) 875-125 MG tablet    Sig: Take 1 tablet by mouth every 12 (twelve) hours.    Dispense:  14 tablet    Refill:  0      *This clinic note was created using Scientist, clinical (histocompatibility and immunogenetics). Therefore, there may be occasional mistakes despite careful proofreading.  ?    Domenick Gong, MD 11/23/21 1640

## 2021-11-23 NOTE — ED Triage Notes (Signed)
Pt presents with nasal congestion and ST x 2 weeks. Pt has also been exposed to Covid.

## 2021-11-23 NOTE — Discharge Instructions (Addendum)
Today is day 0 for your quarantine purposes.  You will need to quarantine for the next 5 days and wear a mask for 5 days after finishing quarantine.  Finish the molnupiravir and Augmentin, even if you feel better.  Flonase, saline nasal irrigation with a [employer] sinus rinse and distilled water as often as you want.  Mucinex.  Get out and walk a little bit every day to help prevent blood clots and pneumonia.  Follow-up with your doctor as needed.

## 2022-03-30 ENCOUNTER — Ambulatory Visit
Admission: EM | Admit: 2022-03-30 | Discharge: 2022-03-30 | Disposition: A | Discharge status: Home / Self Care | Payer: 59 | Attending: Family Medicine | Admitting: Family Medicine

## 2022-03-30 ENCOUNTER — Encounter: Payer: Self-pay | Admitting: Emergency Medicine

## 2022-03-30 DIAGNOSIS — J209 Acute bronchitis, unspecified: Secondary | ICD-10-CM | POA: Insufficient documentation

## 2022-03-30 DIAGNOSIS — J029 Acute pharyngitis, unspecified: Secondary | ICD-10-CM | POA: Diagnosis present

## 2022-03-30 LAB — GROUP A STREP BY PCR: Group A Strep by PCR: NOT DETECTED

## 2022-03-30 MED ORDER — AZITHROMYCIN 250 MG PO TABS: 250.0000 mg | ORAL_TABLET | Freq: Every day | ORAL | 0 refills | Status: DC

## 2022-03-30 MED ORDER — PREDNISONE 10 MG (21) PO TBPK: ORAL_TABLET | Freq: Every day | ORAL | 0 refills | Status: DC

## 2022-03-30 NOTE — ED Provider Notes (Addendum)
[facility]    CSN: [id number] Arrival date & time: 03/30/22  1507      History   Chief Complaint Chief Complaint  Patient presents with   Sore Throat    HPI Casey Townsend is a 41 y.o. male.   HPI   Casey Townsend presents for ongoing sore throat for over a week.  He has been having a cough as well.  States the cough is improving but the sore throat is sticking around longer.  He takes a medication that lowers his immune system for his rheumatoid arthritis.  He has been taking over-the-counter medications and gargling with warm lemon salty water that is not helping.  Denies any recent fevers.  Had some congestion but this again has been improving.  No vomiting, diarrhea, shortness of breath, chest discomfort, abdominal pain or new rash.      Past Medical History:  Diagnosis Date   Diabetes mellitus without complication (HCC)    Hyperlipidemia    Hypertension    Rheumatoid arthritis (HCC)    Vitamin B 12 deficiency     Patient Active Problem List   Diagnosis Date Noted   Essential hypertension 11/30/2014   Hyperlipidemia 11/30/2014   Diabetes mellitus type 2, controlled (HCC) 11/30/2014   Obesity 11/30/2014   B12 deficiency 02/16/2014    Past Surgical History:  Procedure Laterality Date   NO PAST SURGERIES         Home Medications    Prior to Admission medications   Medication Sig Start Date End Date Taking? Authorizing Provider  amLODipine (NORVASC) 10 MG tablet Take 10 mg by mouth daily.  11/20/14  Yes [provider]  azithromycin (ZITHROMAX Z-PAK) 250 MG tablet Take 1 tablet (250 mg total) by mouth daily. Take 2 tablets on day 1 03/30/22  Yes Herby Amick, DO  empagliflozin (JARDIANCE) 10 MG TABS tablet Take by mouth. 07/20/17  Yes [provider]  folic acid (FOLVITE) 1 MG tablet Take 1 mg by mouth daily. 04/17/20  Yes [provider]  hydrochlorothiazide (HYDRODIURIL) 25 MG tablet  11/18/18  Yes [provider]  losartan (COZAAR) 100 MG tablet Take 100 mg by mouth daily. 05/13/20  Yes [provider]  metFORMIN (GLUCOPHAGE) 1000 MG tablet Take 1,000 mg by mouth 2 (two) times daily. 05/17/19  Yes [provider]  methotrexate (RHEUMATREX) 2.5 MG tablet Take 20 mg by mouth once a week. 05/10/20  Yes [provider]  Multiple Vitamin (MULTI-VITAMIN) tablet Take by mouth.   Yes [provider]  predniSONE (STERAPRED UNI-PAK 21 TAB) 10 MG (21) TBPK tablet Take by mouth daily. Take 6 tabs by mouth daily for 1, then 5 tabs for 1 day, then 4 tabs for 1 day, then 3 tabs for 1 day, then 2 tabs for 1 day, then 1 tab for 1 day. 03/30/22  Yes Jovonda Selner, DO  fluticasone (FLONASE) 50 MCG/ACT nasal spray Place 2 sprays into both nostrils daily. 11/23/21   Domenick Gong, MD  hydrocortisone 2.5 % lotion Apply topically 2 (two) times daily. 07/03/20   Becky Augusta, NP  meloxicam (MOBIC) 15 MG tablet Take 15 mg by mouth daily. 06/28/20   [provider]  ONETOUCH VERIO test strip  10/23/14   [provider]  pravastatin (PRAVACHOL) 10 MG tablet Take 10 mg by mouth every other day.  11/04/14 06/23/19  [provider]    Family History Family History  Problem Relation Age of Onset   Hypertension Mother  Hyperlipidemia Mother    Diabetes Mother    Other Father        homicide    Social History Social History   Tobacco Use   Smoking status: Never   Smokeless tobacco: Never  Vaping Use   Vaping Use: Never used  Substance Use Topics   Alcohol use: Yes    Alcohol/week: 2.0 standard drinks of alcohol    Types: 2 Cans of beer per week   Drug use: No     Allergies   Lisinopril and Pravastatin   Review of Systems Review of Systems: negative unless otherwise stated in HPI.      Physical Exam Triage Vital Signs ED Triage Vitals  Enc Vitals Group     BP      Pulse      Resp      Temp      Temp src      SpO2      Weight       Height      Head Circumference      Peak Flow      Pain Score      Pain Loc      Pain Edu?      Excl. in GC?    No data found.  Updated Vital Signs BP (!) 145/98 (BP Location: Right Arm)   Pulse 92   Temp 98.3 F (36.8 C) (Oral)   Resp 16   Ht 5\' 3"  (1.6 m)   Wt 102.1 kg   SpO2 95%   BMI 39.87 kg/m   Visual Acuity Right Eye Distance:   Left Eye Distance:   Bilateral Distance:    Right Eye Near:   Left Eye Near:    Bilateral Near:     Physical Exam GEN:     alert, non-toxic appearing male in no distress    HENT:  mucus membranes moist, oropharyngeal without lesions or exudate, tonsils not visible due to tongue enlargement,  no clear nasal discharge, bilateral TM normal EYES:   pupils equal and reactive, no scleral injection or discharge NECK:  normal ROM, no meningismus   RESP:  no increased work of breathing, clear to auscultation bilaterally CVS:   regular rate and rhythm Skin:   warm and dry    UC Treatments / Results  Labs (all labs ordered are listed, but only abnormal results are displayed) Labs Reviewed  GROUP A STREP BY PCR    EKG   Radiology No results found.  Procedures Procedures (including critical care time)  Medications Ordered in UC Medications - No data to display  Initial Impression / Assessment and Plan / UC Course  I have reviewed the triage vital signs and the nursing notes.  Pertinent labs & imaging results that were available during my care of the patient were reviewed by me and considered in my medical decision making (see chart for details).       Pt is a 41 y.o. male who presents for 1 week of cough and sore throat that is not improving.  [name] is  afebrile here without recent antipyretics. Satting adequately on room air. Overall pt is  non-toxic appearing, well hydrated, without respiratory distress. Pulmonary exam is unremarkable  COVID  and influenza testing deferred due to length of symptoms. Strep PCR negative.   I am concerned as he is immunocompromised that he is not clinical during his sore throat and cough.  Treat acute (presumed bacterial) bronchitis with steroids and antibiotics  as below  Typical duration of symptoms discussed. Return and ED precautions given and patient voiced understanding.   Discussed MDM, treatment plan and plan for follow-up with patient who agrees with plan.      Final Clinical Impressions(s) / UC Diagnoses   Final diagnoses:  Acute pharyngitis, unspecified etiology  Acute bronchitis, unspecified organism     Discharge Instructions      Your strep test is negative. As you are immunocompromised, I am concerned that you have a underlying bacterial infection that you are not clearing.  I sent a Z-pac (azithromycin) to your pharmacy. Stop by and pick it up.      ED Prescriptions     Medication Sig Dispense Auth. Provider   azithromycin (ZITHROMAX Z-PAK) 250 MG tablet Take 1 tablet (250 mg total) by mouth daily. Take 2 tablets on day 1 6 tablet Georgie Haque, DO   predniSONE (STERAPRED UNI-PAK 21 TAB) 10 MG (21) TBPK tablet Take by mouth daily. Take 6 tabs by mouth daily for 1, then 5 tabs for 1 day, then 4 tabs for 1 day, then 3 tabs for 1 day, then 2 tabs for 1 day, then 1 tab for 1 day. 21 tablet Katha Cabal, DO      PDMP not reviewed this encounter.   Katha Cabal, DO 03/30/22 1829    Katha Cabal, DO 03/30/22 1830

## 2022-03-30 NOTE — ED Triage Notes (Signed)
Pt c/o sore throat, and cough. Started about a week ago. He states his nephew recently tested positive for flu. Pt denies fever, congestion.

## 2022-03-30 NOTE — Discharge Instructions (Addendum)
Your strep test is negative. As you are immunocompromised, I am concerned that you have a underlying bacterial infection that you are not clearing.  I sent a Z-pac (azithromycin) to your pharmacy. Stop by and pick it up.

## 2022-12-23 ENCOUNTER — Ambulatory Visit
Admission: EM | Admit: 2022-12-23 | Discharge: 2022-12-23 | Disposition: A | Discharge status: Home / Self Care | Payer: 59 | Attending: Physician Assistant | Admitting: Physician Assistant

## 2022-12-23 DIAGNOSIS — H1011 Acute atopic conjunctivitis, right eye: Secondary | ICD-10-CM

## 2022-12-23 NOTE — ED Provider Notes (Signed)
[facility]    CSN: [id number] Arrival date & time: 12/23/22  0803      History   Chief Complaint Chief Complaint  Patient presents with   Eye Problem    HPI Casey Townsend is a 42 y.o. male with history of diabetes, hypertension, hyperlipidemia, rheumatoid arthritis and obesity.  He presents today for right eye redness for the past 1 week.  He reports crusting in the morning but denies any drainage from the eye.  He says he does not have any pain but just irritation and discomfort.  He states that he rubs his eyes sometimes because of the discomfort.  He has been using naphazoline over-the-counter redness relief eyedrops in the morning and states that complete takes the symptoms away but the symptoms are there every morning and just the right eye.  He has not noticed any nasal congestion or runny nose, cough.  He does have allergies but says they have not seem to be flared up recently.  He has not had any vision changes, headaches, dizziness.  HPI  Past Medical History:  Diagnosis Date   Diabetes mellitus without complication (HCC)    Hyperlipidemia    Hypertension    Rheumatoid arthritis (HCC)    Vitamin B 12 deficiency     Patient Active Problem List   Diagnosis Date Noted   Essential hypertension 11/30/2014   Hyperlipidemia 11/30/2014   Diabetes mellitus type 2, controlled (HCC) 11/30/2014   Obesity 11/30/2014   B12 deficiency 02/16/2014    Past Surgical History:  Procedure Laterality Date   NO PAST SURGERIES         Home Medications    Prior to Admission medications   Medication Sig Start Date End Date Taking? Authorizing Provider  amLODipine (NORVASC) 10 MG tablet Take 10 mg by mouth daily.  11/20/14  Yes [provider]  empagliflozin (JARDIANCE) 10 MG TABS tablet Take by mouth. 07/20/17  Yes [provider]  folic acid (FOLVITE) 1 MG tablet Take 1 mg by mouth daily. 04/17/20  Yes [provider]  hydrochlorothiazide  (HYDRODIURIL) 25 MG tablet  11/18/18  Yes [provider]  hydrocortisone 2.5 % lotion Apply topically 2 (two) times daily. 07/03/20  Yes Becky Augusta, NP  losartan (COZAAR) 100 MG tablet Take 100 mg by mouth daily. 05/13/20  Yes [provider]  meloxicam (MOBIC) 15 MG tablet Take 15 mg by mouth daily. 06/28/20  Yes [provider]  metFORMIN (GLUCOPHAGE) 1000 MG tablet Take 1,000 mg by mouth 2 (two) times daily. 05/17/19  Yes [provider]  methotrexate (RHEUMATREX) 2.5 MG tablet Take 20 mg by mouth once a week. 05/10/20  Yes [provider]  Multiple Vitamin (MULTI-VITAMIN) tablet Take by mouth.   Yes [provider]  ONETOUCH VERIO test strip  10/23/14  Yes [provider]  fluticasone (FLONASE) 50 MCG/ACT nasal spray Place 2 sprays into both nostrils daily. 11/23/21   Domenick Gong, MD  pravastatin (PRAVACHOL) 10 MG tablet Take 10 mg by mouth every other day.  11/04/14 06/23/19  [provider]    Family History Family History  Problem Relation Age of Onset   Hypertension Mother    Hyperlipidemia Mother    Diabetes Mother    Other Father        homicide    Social History Social History   Tobacco Use   Smoking status: Never   Smokeless tobacco: Never  Vaping Use   Vaping status: Never Used  Substance Use Topics   Alcohol use: Yes    Alcohol/week: 2.0 standard drinks of alcohol    Types: 2 Cans of beer per week   Drug use: No     Allergies   Lisinopril and Pravastatin   Review of Systems Review of Systems  Constitutional:  Negative for fatigue and fever.  Eyes:  Positive for redness and itching. Negative for photophobia, pain, discharge and visual disturbance.  Neurological:  Negative for dizziness and headaches.     Physical Exam Triage Vital Signs ED Triage Vitals  Encounter Vitals Group     BP 12/23/22 0815 (!) 145/93     Systolic BP Percentile --      Diastolic BP Percentile --      Pulse  Rate 12/23/22 0815 91     Resp --      Temp 12/23/22 0815 98.7 F (37.1 C)     Temp Source 12/23/22 0815 Oral     SpO2 12/23/22 0815 94 %     Weight 12/23/22 0814 225 lb (102.1 kg)     Height 12/23/22 0814 5\' 1"  (1.549 m)     Head Circumference --      Peak Flow --      Pain Score 12/23/22 0813 4     Pain Loc --      Pain Education --      Exclude from Growth Chart --    No data found.  Updated Vital Signs BP (!) 145/93 (BP Location: Left Arm)   Pulse 91   Temp 98.7 F (37.1 C) (Oral)   Ht 5\' 1"  (1.549 m)   Wt 225 lb (102.1 kg)   SpO2 94%   BMI 42.51 kg/m   Visual Acuity Right Eye Distance: 20/30 Left Eye Distance: 20/50 Bilateral Distance: 20/30   Physical Exam Vitals and nursing note reviewed.  Constitutional:      General: He is not in acute distress.    Appearance: Normal appearance. He is well-developed. He is not ill-appearing.  HENT:     Head: Normocephalic and atraumatic.     Nose: Nose normal.     Mouth/Throat:     Mouth: Mucous membranes are moist.     Pharynx: Oropharynx is clear.  Eyes:     General: Lids are normal. Lids are everted, no foreign bodies appreciated. No scleral icterus.       Right eye: No foreign body.     Conjunctiva/sclera:     Right eye: Right conjunctiva is injected (very minimal injection).  Cardiovascular:     Rate and Rhythm: Normal rate.  Pulmonary:     Effort: Pulmonary effort is normal. No respiratory distress.  Musculoskeletal:        General: No swelling.     Cervical back: Neck supple.  Skin:    General: Skin is warm and dry.     Capillary Refill: Capillary refill takes less than 2 seconds.  Neurological:     General: No focal deficit present.     Mental Status: He is alert. Mental status is at baseline.     Motor: No weakness.     Gait: Gait normal.  Psychiatric:        Mood and Affect: Mood normal.        Behavior: Behavior normal.      UC Treatments / Results  Labs (all labs ordered are listed, but  only abnormal results are displayed) Labs Reviewed - No data to display  EKG  Radiology No results found.  Procedures Procedures (including critical care time)  Medications Ordered in UC Medications - No data to display  Initial Impression / Assessment and Plan / UC Course  I have reviewed the triage vital signs and the nursing notes.  Pertinent labs & imaging results that were available during my care of the patient were reviewed by me and considered in my medical decision making (see chart for details).   42 year old male presents for right eye redness, irritation and itching for the past 1 week.  Has been using over-the-counter redness relief eyedrops and states that relieves his symptoms.  Symptoms are only in the morning and go away after using the drops.  No associated pain, vision changes, discolored drainage.  On exam he has slight injection of conjunctivae without drainage.  Vision intact.  Advised patient he has allergic conjunctivitis of the right eye.  Suggested that he continue the redness relief eyedrops as needed.  Advised trying Zyrtec daily throughout the allergy season.  Reviewed return precautions.   Final Clinical Impressions(s) / UC Diagnoses   Final diagnoses:  Allergic conjunctivitis of right eye     Discharge Instructions      -No signs of infection.  Your symptoms are likely allergy related. - Continue using the eyedrops that you have.  Consider taking Zyrtec.  You may need to do this off and on throughout the allergy season. - If you begin to have pain, vision changes, discolored eye drainage, other worsening symptoms, please return for evaluation.     ED Prescriptions   None    PDMP not reviewed this encounter.   Shirlee Latch, PA-C 12/23/22 [phone]

## 2022-12-23 NOTE — Discharge Instructions (Addendum)
-  No signs of infection.  Your symptoms are likely allergy related. - Continue using the eyedrops that you have.  Consider taking Zyrtec.  You may need to do this off and on throughout the allergy season. - If you begin to have pain, vision changes, discolored eye drainage, other worsening symptoms, please return for evaluation.

## 2022-12-23 NOTE — ED Triage Notes (Signed)
Pt c/o right eye redness and discomfort x1week  Pt uses eyedrops to help the redness - OTC Clear Eyes  Pt denies itching  Pt denies asthma or COPD

## 2023-03-29 ENCOUNTER — Ambulatory Visit (INDEPENDENT_AMBULATORY_CARE_PROVIDER_SITE_OTHER): Payer: 59

## 2023-03-29 ENCOUNTER — Ambulatory Visit
Admission: EM | Admit: 2023-03-29 | Discharge: 2023-03-29 | Disposition: A | Discharge status: Home / Self Care | Payer: 59 | Attending: Emergency Medicine | Admitting: Emergency Medicine

## 2023-03-29 DIAGNOSIS — B9689 Other specified bacterial agents as the cause of diseases classified elsewhere: Secondary | ICD-10-CM

## 2023-03-29 DIAGNOSIS — R051 Acute cough: Secondary | ICD-10-CM | POA: Diagnosis not present

## 2023-03-29 DIAGNOSIS — H109 Unspecified conjunctivitis: Secondary | ICD-10-CM

## 2023-03-29 DIAGNOSIS — J069 Acute upper respiratory infection, unspecified: Secondary | ICD-10-CM | POA: Diagnosis not present

## 2023-03-29 MED ORDER — PROMETHAZINE-DM 6.25-15 MG/5ML PO SYRP: 5.0000 mL | ORAL_SOLUTION | Freq: Four times a day (QID) | ORAL | 0 refills | Status: AC | PRN

## 2023-03-29 MED ORDER — BENZONATATE 100 MG PO CAPS: 200.0000 mg | ORAL_CAPSULE | Freq: Three times a day (TID) | ORAL | 0 refills | Status: AC

## 2023-03-29 MED ORDER — AMOXICILLIN-POT CLAVULANATE 875-125 MG PO TABS: 1.0000 | ORAL_TABLET | Freq: Two times a day (BID) | ORAL | 0 refills | Status: AC

## 2023-03-29 MED ORDER — IPRATROPIUM BROMIDE 0.06 % NA SOLN: 2.0000 | Freq: Four times a day (QID) | NASAL | 12 refills | Status: AC

## 2023-03-29 MED ORDER — MOXIFLOXACIN HCL 0.5 % OP SOLN: 1.0000 [drp] | Freq: Three times a day (TID) | OPHTHALMIC | 0 refills | Status: AC

## 2023-03-29 NOTE — ED Provider Notes (Signed)
[facility]    CSN: [id number] Arrival date & time: 03/29/23  1523      History   Chief Complaint Chief Complaint  Patient presents with   Cough   Nasal Congestion    HPI Casey Townsend is a 42 y.o. male.   HPI  40 old male with past medical history significant for rheumatoid arthritis, hypertension, hyperlipidemia, diabetes, and vitamin B12 deficiency presents for evaluation of 2 weeks worth of respiratory symptoms to include runny nose and nasal congestion for yellow nasal discharge and a cough that is productive for gray sputum with associated shortness breath and wheezing.  Patient denies any fever or ear pain.  Yesterday he developed redness and itching to his left eye that has since spread to his right eye.  When he woke up this morning he had matted eyes with a yellow mucopurulent discharge.  He denies any changes in vision but he does endorse eye itching.  Past Medical History:  Diagnosis Date   Diabetes mellitus without complication (HCC)    Hyperlipidemia    Hypertension    Rheumatoid arthritis (HCC)    Vitamin B 12 deficiency     Patient Active Problem List   Diagnosis Date Noted   Essential hypertension 11/30/2014   Hyperlipidemia 11/30/2014   Diabetes mellitus type 2, controlled (HCC) 11/30/2014   Obesity 11/30/2014   B12 deficiency 02/16/2014    Past Surgical History:  Procedure Laterality Date   NO PAST SURGERIES         Home Medications    Prior to Admission medications   Medication Sig Start Date End Date Taking? Authorizing Provider  amoxicillin-clavulanate (AUGMENTIN) 875-125 MG tablet Take 1 tablet by mouth every 12 (twelve) hours for 7 days. 03/29/23 04/05/23 Yes Becky Augusta, NP  benzonatate (TESSALON) 100 MG capsule Take 2 capsules (200 mg total) by mouth every 8 (eight) hours. 03/29/23  Yes Becky Augusta, NP  empagliflozin (JARDIANCE) 10 MG TABS tablet Take by mouth. 07/20/17  Yes [provider]  folic acid  (FOLVITE) 1 MG tablet Take 1 mg by mouth daily. 04/17/20  Yes [provider]  hydrochlorothiazide (HYDRODIURIL) 25 MG tablet  11/18/18  Yes [provider]  ipratropium (ATROVENT) 0.06 % nasal spray Place 2 sprays into both nostrils 4 (four) times daily. 03/29/23  Yes Becky Augusta, NP  losartan (COZAAR) 100 MG tablet Take 100 mg by mouth daily. 05/13/20  Yes [provider]  metFORMIN (GLUCOPHAGE) 1000 MG tablet Take 1,000 mg by mouth 2 (two) times daily. 05/17/19  Yes [provider]  methotrexate (RHEUMATREX) 2.5 MG tablet Take 20 mg by mouth once a week. 05/10/20  Yes [provider]  moxifloxacin (VIGAMOX) 0.5 % ophthalmic solution Place 1 drop into both eyes 3 (three) times daily for 7 days. 03/29/23 04/05/23 Yes Becky Augusta, NP  Multiple Vitamin (MULTI-VITAMIN) tablet Take by mouth.   Yes [provider]  [facility] VERIO test strip  10/23/14  Yes [provider]  promethazine-dextromethorphan (PROMETHAZINE-DM) 6.25-15 MG/5ML syrup Take 5 mLs by mouth 4 (four) times daily as needed. 03/29/23  Yes Becky Augusta, NP  amLODipine (NORVASC) 10 MG tablet Take 10 mg by mouth daily.  11/20/14   [provider]  fluticasone (FLONASE) 50 MCG/ACT nasal spray Place 2 sprays into both nostrils daily. 11/23/21   Domenick Gong, MD  hydrocortisone 2.5 % lotion Apply topically 2 (two) times daily. 07/03/20   Becky Augusta, NP  meloxicam (MOBIC) 15 MG tablet Take 15 mg  by mouth daily. 06/28/20   [provider]  pravastatin (PRAVACHOL) 10 MG tablet Take 10 mg by mouth every other day.  11/04/14 06/23/19  [provider]    Family History Family History  Problem Relation Age of Onset   Hypertension Mother    Hyperlipidemia Mother    Diabetes Mother    Other Father        homicide    Social History Social History   Tobacco Use   Smoking status: Never   Smokeless tobacco: Never  Vaping Use   Vaping status: Never Used   Substance Use Topics   Alcohol use: Yes    Alcohol/week: 2.0 standard drinks of alcohol    Types: 2 Cans of beer per week   Drug use: No     Allergies   Lisinopril and Pravastatin   Review of Systems Review of Systems  Constitutional:  Negative for fever.  HENT:  Positive for congestion and rhinorrhea. Negative for ear pain and sore throat.   Eyes:  Positive for discharge, redness and itching. Negative for photophobia, pain and visual disturbance.  Respiratory:  Positive for cough, shortness of breath and wheezing.      Physical Exam Triage Vital Signs ED Triage Vitals  Encounter Vitals Group     BP      Systolic BP Percentile      Diastolic BP Percentile      Pulse      Resp      Temp      Temp src      SpO2      Weight      Height      Head Circumference      Peak Flow      Pain Score      Pain Loc      Pain Education      Exclude from Growth Chart    No data found.  Updated Vital Signs BP (!) 159/106 (BP Location: Right Arm)   Pulse 93   Temp 98.3 F (36.8 C) (Oral)   SpO2 94%   Visual Acuity Right Eye Distance:   Left Eye Distance:   Bilateral Distance:    Right Eye Near:   Left Eye Near:    Bilateral Near:     Physical Exam Vitals and nursing note reviewed.  Constitutional:      Appearance: Normal appearance. He is not ill-appearing.  HENT:     Head: Normocephalic and atraumatic.     Right Ear: Tympanic membrane, ear canal and external ear normal. There is no impacted cerumen.     Left Ear: Tympanic membrane, ear canal and external ear normal. There is no impacted cerumen.     Nose: Congestion and rhinorrhea present.     Comments: Nasal mucosa is erythematous and edematous with yellow discharge in both nares.    Mouth/Throat:     Mouth: Mucous membranes are moist.     Pharynx: Oropharynx is clear. Posterior oropharyngeal erythema present. No oropharyngeal exudate.     Comments: Mild erythema to the posterior pharynx with yellow postnasal  drip. Eyes:     General:        Right eye: No discharge.        Left eye: No discharge.     Extraocular Movements: Extraocular movements intact.     Pupils: Pupils are equal, round, and reactive to light.     Comments: Bilateral labral and bulbar conjunctiva are erythematous and injected.  Cardiovascular:  Rate and Rhythm: Normal rate and regular rhythm.     Pulses: Normal pulses.     Heart sounds: Normal heart sounds. No murmur heard.    No friction rub. No gallop.  Pulmonary:     Effort: Pulmonary effort is normal.     Breath sounds: Normal breath sounds. No wheezing, rhonchi or rales.     Comments: Decreased lung sounds in bilateral bases. Musculoskeletal:     Cervical back: Normal range of motion and neck supple. No tenderness.  Lymphadenopathy:     Cervical: No cervical adenopathy.  Skin:    General: Skin is warm and dry.     Capillary Refill: Capillary refill takes less than 2 seconds.     Findings: No rash.  Neurological:     General: No focal deficit present.     Mental Status: He is alert and oriented to person, place, and time.      UC Treatments / Results  Labs (all labs ordered are listed, but only abnormal results are displayed) Labs Reviewed - No data to display  EKG   Radiology DG Chest 2 View Result Date: 03/29/2023 CLINICAL DATA:  Productive cough x 2 weeks. EXAM: CHEST - 2 VIEW COMPARISON:  None Available. FINDINGS: Cardiac silhouette is unremarkable. No pneumothorax or pleural effusion. The lungs are clear. The visualized skeletal structures are unremarkable. IMPRESSION: No acute cardiopulmonary process. Electronically Signed   By: Layla Maw M.D.   On: 03/29/2023 17:44    Procedures Procedures (including critical care time)  Medications Ordered in UC Medications - No data to display  Initial Impression / Assessment and Plan / UC Course  I have reviewed the triage vital signs and the nursing notes.  Pertinent labs & imaging results  that were available during my care of the patient were reviewed by me and considered in my medical decision making (see chart for details).   Patient is a pleasant, nontoxic-appearing 42 year old male presenting for evaluation to which with the respiratory symptoms outlined HPI above.  He reports that he was around his parents who both tested positive for COVID 2 weeks ago when his symptoms began.  He is endorsing nasal congestion with yellow nasal discharge and a cough that is productive for a gray mucus.  Also shortness of breath and wheezing.  He does have a history of rheumatoid arthritis and recently had Cimzia subcu injection.  That was on 03/06/2023.  Given that he is immunocompromise secondary to his rheumatoid arthritis and his biologic injection I will obtain a chest x-ray to evaluate for any acute cardiopulmonary process.  His physical exam is consistent with an upper respiratory infection.  If his chest x-ray shows pneumonia I will discharge him home on dual antibiotic therapy.  If his chest x-ray is clear I will discharge him home on monotherapy for his URI.   In addition, the patient developed itching and redness to both of his eyes with mucopurulent discharge yesterday.  As you can see in image above, he has erythema and injection of bilateral bulbar conjunctiva.  His labral conjunctiva looks similar.  Patient has no appreciable discharge at this time though he does relay that his eyes were matted shut with a yellow mucopurulent discharge this morning.  Pupils are equal round and reactive and EOMs intact.  Patient's exam is consistent with conjunctivitis.  Chest x-ray independently reviewed and evaluated by me.  Impression: Patient is an elevated hemidiaphragm on the right.  Cardiomediastinal silhouette appears normal.  Questionable hiatal hernia  on the left.  There is an opacity present in the lateral view retrocardiac.  There is also an opacity in the right infrahilar region that may  represent infiltrate or a cluster of pulmonary vessels.  Radiology overread is pending. Radiology impression states no active cardiopulmonary disease.  I will discharge patient on the diagnosis of URI with cough and congestion with prescription for Augmentin 875 twice daily for 7 days.  Atrovent Nasabid up with nasal congestion, Tessalon Perles and Promethazine DM cough syrup for cough and congestion.  For his bilateral conjunctivitis I will start him on Vigamox 1 drop 3 times daily for 1 week.  Final Clinical Impressions(s) / UC Diagnoses   Final diagnoses:  Bacterial conjunctivitis of both eyes  URI with cough and congestion     Discharge Instructions      Take the Augmentin 875 mg twice daily with food for 7 days for treatment of your upper respiratory infection.  Use over-the-counter Tylenol and/or ibuprofen according the package instructions as needed for any fever or pain.  Use the Atrovent nasal spray, 2 squirts up each nostril every 6 hours, as needed for runny nose and nasal congestion.  Use the Tessalon Perles every 8 hours during the day as needed for cough.  Take them with a small sip of water.  They may give you numbness to the base of your tongue, or a metallic taste in your mouth, this is normal.  Use the Promethazine DM cough syrup at bedtime as needed for cough or congestion.  Instill 1 drop of Vigamox in each eye every 8 hours for the next 7 days for treatment of your conjunctivitis.  Avoid touching your eyes as much as possible.  Wipe down all surfaces, countertops, and doorknobs after the first and second 24 hours on eyedrops.  Wash her face with a clean wash rag to remove any drainage and use a different portion of the wash rag to clean each eye so as to not reinfect yourself.  Return for reevaluation for any new or worsening symptoms.      ED Prescriptions     Medication Sig Dispense Auth. Provider   amoxicillin-clavulanate (AUGMENTIN) 875-125 MG tablet  Take 1 tablet by mouth every 12 (twelve) hours for 7 days. 14 tablet Becky Augusta, NP   benzonatate (TESSALON) 100 MG capsule Take 2 capsules (200 mg total) by mouth every 8 (eight) hours. 21 capsule Becky Augusta, NP   ipratropium (ATROVENT) 0.06 % nasal spray Place 2 sprays into both nostrils 4 (four) times daily. 15 mL Becky Augusta, NP   promethazine-dextromethorphan (PROMETHAZINE-DM) 6.25-15 MG/5ML syrup Take 5 mLs by mouth 4 (four) times daily as needed. 118 mL Becky Augusta, NP   moxifloxacin (VIGAMOX) 0.5 % ophthalmic solution Place 1 drop into both eyes 3 (three) times daily for 7 days. 3 mL Becky Augusta, NP      PDMP not reviewed this encounter.   Becky Augusta, NP 03/29/23 1751

## 2023-03-29 NOTE — Discharge Instructions (Addendum)
Take the Augmentin 875 mg twice daily with food for 7 days for treatment of your upper respiratory infection.  Use over-the-counter Tylenol and/or ibuprofen according the package instructions as needed for any fever or pain.  Use the Atrovent nasal spray, 2 squirts up each nostril every 6 hours, as needed for runny nose and nasal congestion.  Use the Tessalon Perles every 8 hours during the day as needed for cough.  Take them with a small sip of water.  They may give you numbness to the base of your tongue, or a metallic taste in your mouth, this is normal.  Use the Promethazine DM cough syrup at bedtime as needed for cough or congestion.  Instill 1 drop of Vigamox in each eye every 8 hours for the next 7 days for treatment of your conjunctivitis.  Avoid touching your eyes as much as possible.  Wipe down all surfaces, countertops, and doorknobs after the first and second 24 hours on eyedrops.  Wash her face with a clean wash rag to remove any drainage and use a different portion of the wash rag to clean each eye so as to not reinfect yourself.  Return for reevaluation for any new or worsening symptoms.

## 2023-03-29 NOTE — ED Triage Notes (Signed)
Pt presents to UC c/o cough x2 weeks, stuffy nose x2 weeks, pt states his parents tested positive for covid x2 weeks, pt also reports bilateral eyes watery & red. Pt states he has been taking otc cough medicine with no relief.
